# Patient Record
Sex: Female | Born: 1965 | Race: Asian | Hispanic: No | Marital: Single | Smoking: Never smoker
Health system: Southern US, Community
[De-identification: ages and names within clinical notes are randomized; demographics above are authoritative.]

## PROBLEM LIST (undated history)

## (undated) DIAGNOSIS — G5600 Carpal tunnel syndrome, unspecified upper limb: Secondary | ICD-10-CM

## (undated) DIAGNOSIS — K219 Gastro-esophageal reflux disease without esophagitis: Secondary | ICD-10-CM

---

## 2000-08-08 ENCOUNTER — Other Ambulatory Visit: Admission: RE | Admit: 2000-08-08 | Discharge: 2000-08-08 | Payer: Self-pay | Admitting: *Deleted

## 2001-08-20 ENCOUNTER — Other Ambulatory Visit: Admission: RE | Admit: 2001-08-20 | Discharge: 2001-08-20 | Payer: Self-pay | Admitting: *Deleted

## 2003-01-21 ENCOUNTER — Other Ambulatory Visit: Admission: RE | Admit: 2003-01-21 | Discharge: 2003-01-21 | Payer: Self-pay | Admitting: Obstetrics and Gynecology

## 2006-02-28 ENCOUNTER — Ambulatory Visit: Payer: Self-pay | Admitting: Nurse Practitioner

## 2006-03-20 ENCOUNTER — Ambulatory Visit: Payer: Self-pay | Admitting: *Deleted

## 2006-03-20 ENCOUNTER — Ambulatory Visit: Payer: Self-pay | Admitting: Nurse Practitioner

## 2006-03-20 ENCOUNTER — Encounter (INDEPENDENT_AMBULATORY_CARE_PROVIDER_SITE_OTHER): Payer: Self-pay | Admitting: *Deleted

## 2006-03-20 ENCOUNTER — Other Ambulatory Visit: Admission: RE | Admit: 2006-03-20 | Discharge: 2006-03-20 | Payer: Self-pay | Admitting: Nurse Practitioner

## 2006-11-20 ENCOUNTER — Ambulatory Visit: Payer: Self-pay | Admitting: Internal Medicine

## 2007-02-07 ENCOUNTER — Encounter (INDEPENDENT_AMBULATORY_CARE_PROVIDER_SITE_OTHER): Payer: Self-pay | Admitting: *Deleted

## 2007-07-19 ENCOUNTER — Ambulatory Visit (HOSPITAL_COMMUNITY): Admission: RE | Admit: 2007-07-19 | Discharge: 2007-07-19 | Payer: Self-pay | Admitting: Family Medicine

## 2008-03-26 ENCOUNTER — Encounter: Admission: RE | Admit: 2008-03-26 | Discharge: 2008-03-26 | Payer: Self-pay | Admitting: Family Medicine

## 2009-03-01 ENCOUNTER — Emergency Department (HOSPITAL_COMMUNITY): Admission: EM | Admit: 2009-03-01 | Discharge: 2009-03-01 | Payer: Self-pay | Admitting: Emergency Medicine

## 2009-05-18 ENCOUNTER — Encounter: Admission: RE | Admit: 2009-05-18 | Discharge: 2009-05-18 | Payer: Self-pay | Admitting: Family Medicine

## 2009-10-14 ENCOUNTER — Emergency Department (HOSPITAL_COMMUNITY): Admission: EM | Admit: 2009-10-14 | Discharge: 2009-10-15 | Payer: Self-pay | Admitting: Emergency Medicine

## 2010-08-09 LAB — URINE MICROSCOPIC-ADD ON

## 2010-08-09 LAB — DIFFERENTIAL
Basophils Absolute: 0 10*3/uL (ref 0.0–0.1)
Lymphocytes Relative: 12 % (ref 12–46)
Neutro Abs: 9 10*3/uL — ABNORMAL HIGH (ref 1.7–7.7)
Neutrophils Relative %: 81 % — ABNORMAL HIGH (ref 43–77)

## 2010-08-09 LAB — URINALYSIS, ROUTINE W REFLEX MICROSCOPIC
Ketones, ur: NEGATIVE mg/dL
Nitrite: NEGATIVE
Specific Gravity, Urine: 1.009 (ref 1.005–1.030)
pH: 7.5 (ref 5.0–8.0)

## 2010-08-09 LAB — CBC
Platelets: 166 10*3/uL (ref 150–400)
RDW: 12.3 % (ref 11.5–15.5)
WBC: 11.2 10*3/uL — ABNORMAL HIGH (ref 4.0–10.5)

## 2010-08-09 LAB — POCT I-STAT, CHEM 8
Calcium, Ion: 1.09 mmol/L — ABNORMAL LOW (ref 1.12–1.32)
Chloride: 104 mEq/L (ref 96–112)
Glucose, Bld: 120 mg/dL — ABNORMAL HIGH (ref 70–99)
HCT: 39 % (ref 36.0–46.0)

## 2010-08-26 LAB — POCT PREGNANCY, URINE: Preg Test, Ur: NEGATIVE

## 2010-08-26 LAB — POCT I-STAT, CHEM 8
BUN: 20 mg/dL (ref 6–23)
Calcium, Ion: 1.06 mmol/L — ABNORMAL LOW (ref 1.12–1.32)
Creatinine, Ser: 0.6 mg/dL (ref 0.4–1.2)
TCO2: 21 mmol/L (ref 0–100)

## 2010-08-26 LAB — POCT URINALYSIS DIP (DEVICE)
Protein, ur: NEGATIVE mg/dL
Specific Gravity, Urine: 1.02 (ref 1.005–1.030)
Urobilinogen, UA: 1 mg/dL (ref 0.0–1.0)

## 2010-08-26 LAB — POCT H PYLORI SCREEN: H. PYLORI SCREEN, POC: NEGATIVE

## 2010-10-06 ENCOUNTER — Other Ambulatory Visit: Payer: Self-pay | Admitting: Family Medicine

## 2010-10-06 ENCOUNTER — Ambulatory Visit
Admission: RE | Admit: 2010-10-06 | Discharge: 2010-10-06 | Disposition: A | Discharge status: Home / Self Care | Payer: Medicaid Other | Source: Ambulatory Visit | Attending: Family Medicine | Admitting: Family Medicine

## 2010-10-06 DIAGNOSIS — R1013 Epigastric pain: Secondary | ICD-10-CM

## 2010-10-06 DIAGNOSIS — Z1231 Encounter for screening mammogram for malignant neoplasm of breast: Secondary | ICD-10-CM

## 2010-10-07 ENCOUNTER — Other Ambulatory Visit: Payer: Self-pay | Admitting: Gastroenterology

## 2010-10-07 DIAGNOSIS — R1013 Epigastric pain: Secondary | ICD-10-CM

## 2010-10-11 ENCOUNTER — Other Ambulatory Visit: Payer: Medicaid Other

## 2010-10-12 ENCOUNTER — Ambulatory Visit
Admission: RE | Admit: 2010-10-12 | Discharge: 2010-10-12 | Disposition: A | Discharge status: Home / Self Care | Payer: Medicaid Other | Source: Ambulatory Visit | Attending: Family Medicine | Admitting: Family Medicine

## 2010-10-12 DIAGNOSIS — Z1231 Encounter for screening mammogram for malignant neoplasm of breast: Secondary | ICD-10-CM

## 2010-10-13 ENCOUNTER — Ambulatory Visit
Admission: RE | Admit: 2010-10-13 | Discharge: 2010-10-13 | Disposition: A | Discharge status: Home / Self Care | Payer: Medicaid Other | Source: Ambulatory Visit | Attending: Gastroenterology | Admitting: Gastroenterology

## 2010-10-13 ENCOUNTER — Other Ambulatory Visit: Payer: Medicaid Other

## 2010-10-13 DIAGNOSIS — R1013 Epigastric pain: Secondary | ICD-10-CM

## 2011-04-30 ENCOUNTER — Encounter (HOSPITAL_COMMUNITY): Payer: Self-pay | Admitting: *Deleted

## 2011-04-30 ENCOUNTER — Encounter: Payer: Self-pay | Admitting: *Deleted

## 2011-04-30 ENCOUNTER — Emergency Department (HOSPITAL_COMMUNITY): Payer: Medicaid Other

## 2011-04-30 ENCOUNTER — Emergency Department (HOSPITAL_COMMUNITY)
Admission: EM | Admit: 2011-04-30 | Discharge: 2011-05-01 | Disposition: A | Discharge status: Home / Self Care | Payer: Medicaid Other | Attending: Emergency Medicine | Admitting: Emergency Medicine

## 2011-04-30 ENCOUNTER — Emergency Department (INDEPENDENT_AMBULATORY_CARE_PROVIDER_SITE_OTHER)
Admission: EM | Admit: 2011-04-30 | Discharge: 2011-04-30 | Disposition: A | Discharge status: Nursing Home (Includes ICF) | Payer: Medicaid Other | Source: Home / Self Care | Attending: Emergency Medicine | Admitting: Emergency Medicine

## 2011-04-30 DIAGNOSIS — R1013 Epigastric pain: Secondary | ICD-10-CM

## 2011-04-30 DIAGNOSIS — R1011 Right upper quadrant pain: Secondary | ICD-10-CM

## 2011-04-30 DIAGNOSIS — R110 Nausea: Secondary | ICD-10-CM | POA: Insufficient documentation

## 2011-04-30 DIAGNOSIS — K219 Gastro-esophageal reflux disease without esophagitis: Secondary | ICD-10-CM | POA: Insufficient documentation

## 2011-04-30 DIAGNOSIS — R109 Unspecified abdominal pain: Secondary | ICD-10-CM | POA: Insufficient documentation

## 2011-04-30 LAB — COMPREHENSIVE METABOLIC PANEL
ALT: 9 U/L (ref 0–35)
Albumin: 3.7 g/dL (ref 3.5–5.2)
Alkaline Phosphatase: 55 U/L (ref 39–117)
BUN: 11 mg/dL (ref 6–23)
Chloride: 105 mEq/L (ref 96–112)
Potassium: 4.7 mEq/L (ref 3.5–5.1)
Sodium: 139 mEq/L (ref 135–145)
Total Bilirubin: 0.3 mg/dL (ref 0.3–1.2)
Total Protein: 7.9 g/dL (ref 6.0–8.3)

## 2011-04-30 LAB — CBC
HCT: 37.9 % (ref 36.0–46.0)
Hemoglobin: 12.8 g/dL (ref 12.0–15.0)
RDW: 12.7 % (ref 11.5–15.5)
WBC: 13.8 10*3/uL — ABNORMAL HIGH (ref 4.0–10.5)

## 2011-04-30 LAB — LIPASE, BLOOD: Lipase: 33 U/L (ref 11–59)

## 2011-04-30 NOTE — ED Notes (Signed)
Patient transported to Ultrasound 

## 2011-04-30 NOTE — ED Notes (Signed)
Pt returned from US

## 2011-04-30 NOTE — ED Provider Notes (Signed)
History     CSN: [id number] Arrival date & time: 04/30/2011  8:47 PM   First MD Initiated Contact with Patient 04/30/11 2101      Chief Complaint  Patient presents with  . Abdominal Pain    (Consider location/radiation/quality/duration/timing/severity/associated sxs/prior treatment) Patient is a 45 y.o. female presenting with abdominal pain. The history is provided by the patient and a relative. The history is limited by a language barrier.  Abdominal Pain The primary symptoms of the illness include abdominal pain and nausea. The primary symptoms of the illness do not include fever, shortness of breath, vomiting, diarrhea or dysuria. The current episode started more than 2 days ago. The problem has not changed (waxing and waning) since onset. The abdominal pain is located in the epigastric region and RUQ. The abdominal pain does not radiate. The severity of the abdominal pain is 6/10. The abdominal pain is relieved by nothing. Exacerbated by: nothing.  The patient states that she believes she is currently not pregnant. The patient has not had a change in bowel habit. Symptoms associated with the illness do not include chills or back pain. Significant associated medical issues include GERD. Significant associated medical issues do not include PUD.    Past Medical History  Diagnosis Date  . Acid reflux     History reviewed. No pertinent past surgical history.  History reviewed. No pertinent family history.  History  Substance Use Topics  . Smoking status: Never Smoker   . Smokeless tobacco: Not on file  . Alcohol Use: No    OB History    Grav Para Term Preterm Abortions TAB SAB Ect Mult Living                  Review of Systems  Constitutional: Negative for fever, chills and appetite change.  Respiratory: Negative for cough and shortness of breath.   Cardiovascular: Negative for chest pain and palpitations.  Gastrointestinal: Positive for nausea and abdominal pain.  Negative for vomiting, diarrhea and abdominal distention.  Genitourinary: Negative for dysuria.  Musculoskeletal: Negative for back pain.  Skin: Negative for color change and rash.  Neurological: Negative for light-headedness and headaches.  All other systems reviewed and are negative.    Allergies  Review of patient's allergies indicates no known allergies.  Home Medications   Current Outpatient Rx  Name Route Sig Dispense Refill  . OMEPRAZOLE 40 MG PO CPDR Oral Take 40 mg by mouth daily.        BP 146/93  Pulse 102  Temp(Src) 100.1 F (37.8 C) (Oral)  Resp 20  SpO2 100%  LMP 04/26/2011  Physical Exam  Nursing note and vitals reviewed. Constitutional: She is oriented to person, place, and time. She appears well-developed and well-nourished.  HENT:  Head: Normocephalic and atraumatic.  Eyes: Pupils are equal, round, and reactive to light.  Cardiovascular: Normal rate, regular rhythm, normal heart sounds and intact distal pulses.   Pulmonary/Chest: Effort normal and breath sounds normal. No respiratory distress.  Abdominal: Soft. She exhibits no distension. There is tenderness (mild, RUQ, epigastric). There is no rebound, no guarding and negative Murphy's sign.  Neurological: She is alert and oriented to person, place, and time.  Skin: Skin is warm and dry.  Psychiatric: She has a normal mood and affect.    ED Course  Procedures (including critical care time)  Labs Reviewed  CBC - Abnormal; Notable for the following:    WBC 13.8 (*)    All other components within normal  limits  COMPREHENSIVE METABOLIC PANEL - Abnormal; Notable for the following:    Glucose, Bld 101 (*)    Creatinine, Ser 0.48 (*)    All other components within normal limits  LIPASE, BLOOD   US Abdomen Complete  04/30/2011  *RADIOLOGY REPORT*  Clinical Data:  Mid abdominal pain.  COMPLETE ABDOMINAL ULTRASOUND 04/30/2011:  Comparison:  Abdominal ultrasound 07/19/2007 [facility]. CT  abdomen 03/26/2008 [facility].  Findings:  Gallbladder:  No shadowing gallstones or echogenic sludge.  No gallbladder wall thickening or pericholecystic fluid.  Negative sonographic Murphy's sign according to the ultrasound technologist.  Common bile duct:  Normal in caliber with maximum diameter approximating 3 mm.  Liver:  Normal size and echotexture without focal parenchymal abnormality.  Patent portal vein with hepatopetal flow.  IVC:  Patent.  Pancreas:  Although the pancreas is difficult to visualize in its entirety, no focal pancreatic abnormality is identified.  Spleen:  Normal size and echotexture without focal parenchymal abnormality.  Right Kidney:  No hydronephrosis.  Well-preserved cortex.  No shadowing calculi.  Normal size and parenchymal echotexture without focal abnormalities.  Approximately 11.0 cm in length.  Left Kidney:  No hydronephrosis.  Well-preserved cortex.  No shadowing calculi.  Normal size and parenchymal echotexture without focal abnormalities.  Approximately 11.2 cm in length.  Abdominal aorta:  Normal in caliber throughout its visualized course in the abdomen without significant atherosclerosis.  IMPRESSION: Normal examination.  Original Report Authenticated By: Arnell Sieving, M.D.     1. Abdominal pain, acute, epigastric   2. Reflux       MDM  This is a 45 year old female with a 3 days history of intermittent epigastric and right upper quadrant pain. She states these episodes happen spontaneously, without recognizable inciting factors, and last approximately an hour before resolving. Describes it as a sharp, well localized pain, without radiation. Has been having mild nausea without vomiting, as well as subjective fevers at home. Has a history of reflux, though states this is different from her normal pain. She denies any lower abdominal pain, dysuria, chest pain, shortness of breath, changes in bowel movements or any other complaints. The patient is  currently denying any pain at this point in time. Exam is remarkable for very mild epigastric and right upper quadrant tenderness, without rebound or guarding, without a Murphy sign. Her temperature is noted to be 100.1. Even though she is currently without complaints, the intermittent quality of her pain does raise suspicion for possible cholelithiasis. Will check lab work and ultrasound to evaluate further.  She has mild leukocytosis, labs otherwise unremarkable. Ultrasound is negative for acute findings. The patient's exam remained unchanged, without any abdominal tenderness, and she currently denies abdominal pain. Discussed with patient that her symptoms are likely due to reflux, and to continue taking the medication as prescribed by her doctor, with followup in about one to 2 weeks for reevaluation of her symptoms at that time. She can take over-the-counter medications for additional symptom relief in that period of time. Discussed indications for return. Patient and family expressed understanding.       Theotis Burrow, MD 04/30/11 2352

## 2011-04-30 NOTE — ED Notes (Signed)
Phlebotomist called for blood draw

## 2011-04-30 NOTE — ED Provider Notes (Signed)
History     CSN: [id number] Arrival date & time: 04/30/2011  6:28 PM   First MD Initiated Contact with Patient 04/30/11 1722      Chief Complaint  Patient presents with  . Abdominal Pain    (Consider location/radiation/quality/duration/timing/severity/associated sxs/prior treatment) HPI Comments: Been having pian since Tuesday getting worse (points to RUQ and epigastric area)  Patient is a 45 y.o. female presenting with abdominal pain. The history is provided by the patient and a relative. The history is limited by a language barrier. A language interpreter was used.  Abdominal Pain The primary symptoms of the illness include abdominal pain. The primary symptoms of the illness do not include fever, fatigue, shortness of breath, nausea, vomiting, dysuria or vaginal bleeding. Episode onset: x 5 days. The onset of the illness was sudden.  The patient states that she believes she is currently not pregnant. The patient has not had a change in bowel habit. Additional symptoms associated with the illness include chills and anorexia. Symptoms associated with the illness do not include constipation, urgency, frequency or back pain. Significant associated medical issues include GERD and substance abuse. Significant associated medical issues do not include PUD, diabetes or cardiac disease.    No past medical history on file.  No past surgical history on file.  No family history on file.  History  Substance Use Topics  . Smoking status: Not on file  . Smokeless tobacco: Not on file  . Alcohol Use: Not on file    OB History    No data available      Review of Systems  Constitutional: Positive for chills. Negative for fever and fatigue.  Respiratory: Negative for shortness of breath.   Gastrointestinal: Positive for abdominal pain and anorexia. Negative for nausea, vomiting and constipation.  Genitourinary: Negative for dysuria, urgency, frequency and vaginal bleeding.    Musculoskeletal: Negative for back pain.    Allergies  Review of patient's allergies indicates not on file.  Home Medications  No current outpatient prescriptions on file.  BP 141/82  Pulse 87  Temp(Src) 97.7 F (36.5 C) (Oral)  Resp 20  SpO2 100%  Physical Exam  ED Course  Procedures (including critical care time)  Labs Reviewed - No data to display No results found.   1. Right upper quadrant pain       MDM  RUQ pian for 5 days. Transferred to rule out complicated  Gallbladder disorder- (cholecystitis vs lithiasis). Stable.        Jimmie Molly, MD 04/30/11 [phone]

## 2011-04-30 NOTE — ED Notes (Signed)
Patient transferred from urgent care for further evaluation of his abdominal pain.  RUQ pain for five days.  Transferred to rule out gallbladder problems.

## 2011-04-30 NOTE — ED Notes (Signed)
Pt speadks vietnamese daughter and husband speak english

## 2011-04-30 NOTE — ED Notes (Signed)
Pt with right upper /epigastric abdominal pain onset Tuesday denies n/v -

## 2011-04-30 NOTE — ED Notes (Signed)
A&Ox3; no signs of distress; ambulated with a steady gait; respirations even and unlabored; no questions at this time.

## 2011-05-01 NOTE — ED Provider Notes (Signed)
I saw and evaluated the patient, reviewed the resident's note and I agree with the findings and plan. The patient with mild epigastric abdominal pain and right upper quadrant pain who was sent from urgent care for further evaluation. Patient was recently placed on PPIs but symptoms are not improving after taking them for 2 days. On my exam patient abdomen is soft and nontender lab work is unrevealing. Will have her follow up with PCP.   Gwyneth Sprout, MD 05/01/11 1524

## 2011-12-22 ENCOUNTER — Other Ambulatory Visit: Payer: Self-pay | Admitting: Family Medicine

## 2011-12-22 DIAGNOSIS — Z1231 Encounter for screening mammogram for malignant neoplasm of breast: Secondary | ICD-10-CM

## 2011-12-29 ENCOUNTER — Ambulatory Visit
Admission: RE | Admit: 2011-12-29 | Discharge: 2011-12-29 | Disposition: A | Discharge status: Home / Self Care | Payer: Medicaid Other | Source: Ambulatory Visit | Attending: Family Medicine | Admitting: Family Medicine

## 2011-12-29 DIAGNOSIS — Z1231 Encounter for screening mammogram for malignant neoplasm of breast: Secondary | ICD-10-CM

## 2012-05-24 ENCOUNTER — Encounter (HOSPITAL_COMMUNITY): Payer: Self-pay | Admitting: *Deleted

## 2012-05-24 ENCOUNTER — Emergency Department (HOSPITAL_COMMUNITY)
Admission: EM | Admit: 2012-05-24 | Discharge: 2012-05-25 | Disposition: A | Discharge status: Home / Self Care | Payer: Medicaid Other | Attending: Emergency Medicine | Admitting: Emergency Medicine

## 2012-05-24 ENCOUNTER — Emergency Department (HOSPITAL_COMMUNITY): Payer: Medicaid Other

## 2012-05-24 DIAGNOSIS — R1084 Generalized abdominal pain: Secondary | ICD-10-CM | POA: Insufficient documentation

## 2012-05-24 DIAGNOSIS — R195 Other fecal abnormalities: Secondary | ICD-10-CM | POA: Insufficient documentation

## 2012-05-24 DIAGNOSIS — K219 Gastro-esophageal reflux disease without esophagitis: Secondary | ICD-10-CM | POA: Insufficient documentation

## 2012-05-24 DIAGNOSIS — R109 Unspecified abdominal pain: Secondary | ICD-10-CM

## 2012-05-24 LAB — COMPREHENSIVE METABOLIC PANEL
ALT: 11 U/L (ref 0–35)
AST: 25 U/L (ref 0–37)
Alkaline Phosphatase: 45 U/L (ref 39–117)
CO2: 23 mEq/L (ref 19–32)
Calcium: 9.1 mg/dL (ref 8.4–10.5)
GFR calc non Af Amer: >90 mL/min (ref >90)
Glucose, Bld: 96 mg/dL (ref 70–99)
Potassium: 4.8 mEq/L (ref 3.5–5.1)
Sodium: 135 mEq/L (ref 135–145)
Total Protein: 7.8 g/dL (ref 6.0–8.3)

## 2012-05-24 LAB — URINALYSIS, ROUTINE W REFLEX MICROSCOPIC
Bilirubin Urine: NEGATIVE
Glucose, UA: NEGATIVE mg/dL
Hgb urine dipstick: NEGATIVE
Ketones, ur: NEGATIVE mg/dL
Nitrite: NEGATIVE
Specific Gravity, Urine: 1.012 (ref 1.005–1.030)
pH: 7 (ref 5.0–8.0)

## 2012-05-24 LAB — URINE MICROSCOPIC-ADD ON

## 2012-05-24 LAB — CBC WITH DIFFERENTIAL/PLATELET
Basophils Absolute: 0 10*3/uL (ref 0.0–0.1)
Eosinophils Relative: 0 % (ref 0–5)
Lymphocytes Relative: 20 % (ref 12–46)
Lymphs Abs: 2.3 10*3/uL (ref 0.7–4.0)
MCV: 87.8 fL (ref 78.0–100.0)
Neutrophils Relative %: 75 % (ref 43–77)
Platelets: 225 10*3/uL (ref 150–400)
RBC: 4.68 MIL/uL (ref 3.87–5.11)
RDW: 12.7 % (ref 11.5–15.5)
WBC: 11.3 10*3/uL — ABNORMAL HIGH (ref 4.0–10.5)

## 2012-05-24 MED ORDER — SODIUM CHLORIDE 0.9 % IV BOLUS (SEPSIS)
1000.0000 mL | Freq: Once | INTRAVENOUS | Status: AC
  Administered 2012-05-24: 1000 mL via INTRAVENOUS

## 2012-05-24 MED ORDER — IOHEXOL 300 MG/ML  SOLN
100.0000 mL | Freq: Once | INTRAMUSCULAR | Status: AC | PRN
  Administered 2012-05-24: 100 mL via INTRAVENOUS

## 2012-05-24 NOTE — ED Notes (Signed)
Pt in with daughter who is helping to translate. Pt reports generalized abd pain x2 days. Denies n/v. "small amount" of diarrhea.

## 2012-05-24 NOTE — ED Provider Notes (Signed)
History     CSN: [id number]  Arrival date & time 05/24/12  1757   First MD Initiated Contact with Patient 05/24/12 2201      Chief Complaint  Patient presents with  . Abdominal Pain    (Consider location/radiation/quality/duration/timing/severity/associated sxs/prior treatment) HPI Comments: Patient presents with a two-day history of worsening pain to her abdomen. She describes it is across the middle and left side of her abdomen. She's had small amount of loose stools but no ongoing diarrhea. No constipation. No nausea or vomiting. No fevers or chills. She denies any urinary symptoms. She denies a history of bowel problems in the past. She's had C-section but no other abdominal surgeries.  Patient is a 47 y.o. female presenting with abdominal pain.  Abdominal Pain The primary symptoms of the illness include abdominal pain. The primary symptoms of the illness do not include fever, fatigue, shortness of breath, nausea, vomiting or diarrhea.  Symptoms associated with the illness do not include chills, diaphoresis, hematuria, frequency or back pain.    Past Medical History  Diagnosis Date  . Acid reflux     Past Surgical History  Procedure Date  . Cesarean section     No family history on file.  History  Substance Use Topics  . Smoking status: Never Smoker   . Smokeless tobacco: Not on file  . Alcohol Use: No    OB History    Grav Para Term Preterm Abortions TAB SAB Ect Mult Living                  Review of Systems  Constitutional: Negative for fever, chills, diaphoresis and fatigue.  HENT: Negative for congestion, rhinorrhea and sneezing.   Eyes: Negative.   Respiratory: Negative for cough, chest tightness and shortness of breath.   Cardiovascular: Negative for chest pain and leg swelling.  Gastrointestinal: Positive for abdominal pain. Negative for nausea, vomiting, diarrhea and blood in stool.  Genitourinary: Negative for frequency, hematuria, flank pain and  difficulty urinating.  Musculoskeletal: Negative for back pain and arthralgias.  Skin: Negative for rash.  Neurological: Negative for dizziness, speech difficulty, weakness, numbness and headaches.    Allergies  Review of patient's allergies indicates no known allergies.  Home Medications   Current Outpatient Rx  Name  Route  Sig  Dispense  Refill  . ACETAMINOPHEN 500 MG PO TABS   Oral   Take 500 mg by mouth every 6 (six) hours as needed.         Marland Kitchen DICYCLOMINE HCL 20 MG PO TABS   Oral   Take 1 tablet (20 mg total) by mouth 2 (two) times daily.   20 tablet   0     BP 143/86  Pulse 86  Temp 97.7 F (36.5 C) (Oral)  Resp 16  SpO2 100%  LMP 05/23/2012  Physical Exam  Constitutional: She is oriented to person, place, and time. She appears well-developed and well-nourished.  HENT:  Head: Normocephalic and atraumatic.  Eyes: Pupils are equal, round, and reactive to light.  Neck: Normal range of motion. Neck supple.  Cardiovascular: Normal rate, regular rhythm and normal heart sounds.   Pulmonary/Chest: Effort normal and breath sounds normal. No respiratory distress. She has no wheezes. She has no rales. She exhibits no tenderness.  Abdominal: Soft. Bowel sounds are normal. There is tenderness (mild tenderness in mid and left abdomen. Also some mild tenderness in lower abdomen but no specific pain in the right lower quadrant. No pain over the  gallbladder.). There is no rebound and no guarding.  Musculoskeletal: Normal range of motion. She exhibits no edema.  Lymphadenopathy:    She has no cervical adenopathy.  Neurological: She is alert and oriented to person, place, and time.  Skin: Skin is warm and dry. No rash noted.  Psychiatric: She has a normal mood and affect.    ED Course  Procedures (including critical care time)  Results for orders placed during the hospital encounter of 05/24/12  URINALYSIS, ROUTINE W REFLEX MICROSCOPIC      Component Value Range   Color,  Urine YELLOW  YELLOW   APPearance CLEAR  CLEAR   Specific Gravity, Urine 1.012  1.005 - 1.030   pH 7.0  5.0 - 8.0   Glucose, UA NEGATIVE  NEGATIVE mg/dL   Hgb urine dipstick NEGATIVE  NEGATIVE   Bilirubin Urine NEGATIVE  NEGATIVE   Ketones, ur NEGATIVE  NEGATIVE mg/dL   Protein, ur NEGATIVE  NEGATIVE mg/dL   Urobilinogen, UA 0.2  0.0 - 1.0 mg/dL   Nitrite NEGATIVE  NEGATIVE   Leukocytes, UA TRACE (*) NEGATIVE  PREGNANCY, URINE      Component Value Range   Preg Test, Ur NEGATIVE  NEGATIVE  URINE MICROSCOPIC-ADD ON      Component Value Range   Squamous Epithelial / LPF FEW (*) RARE   WBC, UA 0-2  <3 WBC/hpf   Bacteria, UA FEW (*) RARE   Urine-Other MUCOUS PRESENT    CBC WITH DIFFERENTIAL      Component Value Range   WBC 11.3 (*) 4.0 - 10.5 K/uL   RBC 4.68  3.87 - 5.11 MIL/uL   Hemoglobin 14.1  12.0 - 15.0 g/dL   HCT 16.1  09.6 - 04.5 %   MCV 87.8  78.0 - 100.0 fL   MCH 30.1  26.0 - 34.0 pg   MCHC 34.3  30.0 - 36.0 g/dL   RDW 40.9  81.1 - 91.4 %   Platelets 225  150 - 400 K/uL   Neutrophils Relative 75  43 - 77 %   Neutro Abs 8.4 (*) 1.7 - 7.7 K/uL   Lymphocytes Relative 20  12 - 46 %   Lymphs Abs 2.3  0.7 - 4.0 K/uL   Monocytes Relative 4  3 - 12 %   Monocytes Absolute 0.5  0.1 - 1.0 K/uL   Eosinophils Relative 0  0 - 5 %   Eosinophils Absolute 0.1  0.0 - 0.7 K/uL   Basophils Relative 0  0 - 1 %   Basophils Absolute 0.0  0.0 - 0.1 K/uL  COMPREHENSIVE METABOLIC PANEL      Component Value Range   Sodium 135  135 - 145 mEq/L   Potassium 4.8  3.5 - 5.1 mEq/L   Chloride 101  96 - 112 mEq/L   CO2 23  19 - 32 mEq/L   Glucose, Bld 96  70 - 99 mg/dL   BUN 13  6 - 23 mg/dL   Creatinine, Ser 7.82 (*) 0.50 - 1.10 mg/dL   Calcium 9.1  8.4 - 95.6 mg/dL   Total Protein 7.8  6.0 - 8.3 g/dL   Albumin 3.8  3.5 - 5.2 g/dL   AST 25  0 - 37 U/L   ALT 11  0 - 35 U/L   Alkaline Phosphatase 45  39 - 117 U/L   Total Bilirubin 0.3  0.3 - 1.2 mg/dL   GFR calc non Af Amer >90  >90 mL/min  GFR calc Af Amer >90  >90 mL/min  LIPASE, BLOOD      Component Value Range   Lipase 34  11 - 59 U/L   Ct Abdomen Pelvis W Contrast  05/24/2012  *RADIOLOGY REPORT*  Clinical Data: Left abdominal pain with diarrhea for 2 days.  Mild leukocytosis.  CT ABDOMEN AND PELVIS WITH CONTRAST  Technique:  Multidetector CT imaging of the abdomen and pelvis was performed following the standard protocol during bolus administration of intravenous contrast.  Contrast: OMNIPAQUE IOHEXOL 300 MG/ML  SOLN  Comparison: Acute abdominal series same date.  Abdominal CT 03/26/2008.  Findings: The lung bases are clear.  There is no pleural effusion. The liver, gallbladder, biliary system and pancreas appear normal. The spleen, adrenal glands and kidneys appear normal.  The stomach and small bowel appear normal.  A retrocecal appendix is slightly more prominent than on the prior examination with a maximal transverse diameter of 7 mm.  No surrounding inflammatory change is seen. At the base of the appendix, there is apparent wall thickening involving the cecum, best seen on the coronal images 34 and 35.  There is no surrounding inflammatory change.  The colon otherwise appears normal.  The uterus, ovaries and bladder appears stable.  There is an intrauterine device.  There are no enlarged abdominal pelvic lymph nodes.  Mild aorto iliac atherosclerosis appears unchanged.  IMPRESSION:  1.  No explanation for left lower quadrant abdominal pain demonstrated. 2.  There is atypical wall thickening involving the cecum at the base of the appendix.  The appendix is only slightly more prominent than on the prior CT and demonstrates no surrounding inflammatory change. This could reflect an atypical inflammatory process, but does not have an appearance suggestive of acute appendicitis. Short-term follow-up may be helpful if the patient remains symptomatic. 3.  Unremarkable solid parenchymal organs.   Original Report Authenticated By: Carey Bullocks, M.D.    Dg Abd Acute W/chest  05/24/2012  *RADIOLOGY REPORT*  Clinical Data: Abdominal pain.  ACUTE ABDOMEN SERIES (ABDOMEN 2 VIEW & CHEST 1 VIEW)  Comparison: Upper GI series 10/13/2010.  Chest radiographs 10/06/2010.  Findings: The heart size and mediastinal contours are stable.  The lungs are clear.  There is no pleural effusion or pneumothorax.  The bowel gas pattern is normal.  There is no free intraperitoneal air or suspicious calcification.  An intrauterine device is noted. The osseous structures are unchanged.  IMPRESSION: No active cardiopulmonary or abdominal process identified.   Original Report Authenticated By: Carey Bullocks, M.D.        1. Abdominal pain       MDM  Patient with minimal left-sided abdominal pain. Her abdominal exam is unremarkable. Her CT scan did not show any evidence of diverticulitis or other etiology for the pain. There is a question of some inflammation around the appendix so well by the patient to return in 24 hours for recheck or sooner if her symptoms worsen. If she can follow up with her primary care physician for a recheck she could do that as well.        Rolan Bucco, MD 05/25/12 [phone]

## 2012-05-25 MED ORDER — DICYCLOMINE HCL 20 MG PO TABS: 20.0000 mg | ORAL_TABLET | Freq: Two times a day (BID) | ORAL | Status: DC

## 2013-04-04 ENCOUNTER — Other Ambulatory Visit: Payer: Self-pay | Admitting: Family Medicine

## 2013-04-04 DIAGNOSIS — Z1231 Encounter for screening mammogram for malignant neoplasm of breast: Secondary | ICD-10-CM

## 2013-04-09 ENCOUNTER — Encounter: Payer: Self-pay | Admitting: Gastroenterology

## 2013-05-02 ENCOUNTER — Ambulatory Visit: Payer: Medicaid Other

## 2013-05-14 ENCOUNTER — Ambulatory Visit: Payer: Medicaid Other

## 2013-05-21 ENCOUNTER — Ambulatory Visit
Admission: RE | Admit: 2013-05-21 | Discharge: 2013-05-21 | Disposition: A | Discharge status: Home / Self Care | Payer: Medicaid Other | Source: Ambulatory Visit | Attending: Family Medicine | Admitting: Family Medicine

## 2013-05-21 DIAGNOSIS — Z1231 Encounter for screening mammogram for malignant neoplasm of breast: Secondary | ICD-10-CM

## 2013-05-27 ENCOUNTER — Ambulatory Visit: Payer: Medicaid Other | Admitting: Gastroenterology

## 2013-06-17 ENCOUNTER — Ambulatory Visit: Payer: Medicaid Other | Admitting: Gastroenterology

## 2013-06-19 ENCOUNTER — Ambulatory Visit: Payer: Medicaid Other | Admitting: Gastroenterology

## 2013-06-21 ENCOUNTER — Encounter: Payer: Self-pay | Admitting: Gastroenterology

## 2013-07-23 ENCOUNTER — Encounter: Payer: Self-pay | Admitting: Gastroenterology

## 2013-07-23 ENCOUNTER — Other Ambulatory Visit (INDEPENDENT_AMBULATORY_CARE_PROVIDER_SITE_OTHER): Payer: Medicaid Other

## 2013-07-23 ENCOUNTER — Ambulatory Visit (INDEPENDENT_AMBULATORY_CARE_PROVIDER_SITE_OTHER): Payer: Medicaid Other | Admitting: Gastroenterology

## 2013-07-23 VITALS — BP 114/80 | HR 80 | Ht 61.0 in | Wt 141.4 lb

## 2013-07-23 DIAGNOSIS — R1013 Epigastric pain: Secondary | ICD-10-CM | POA: Insufficient documentation

## 2013-07-23 DIAGNOSIS — K219 Gastro-esophageal reflux disease without esophagitis: Secondary | ICD-10-CM

## 2013-07-23 LAB — HEPATIC FUNCTION PANEL
ALK PHOS: 46 U/L (ref 39–117)
ALT: 13 U/L (ref 0–35)
AST: 18 U/L (ref 0–37)
Albumin: 4.1 g/dL (ref 3.5–5.2)
BILIRUBIN DIRECT: 0 mg/dL (ref 0.0–0.3)
BILIRUBIN TOTAL: 0.7 mg/dL (ref 0.3–1.2)
Total Protein: 8 g/dL (ref 6.0–8.3)

## 2013-07-23 LAB — CBC WITH DIFFERENTIAL/PLATELET
Basophils Absolute: 0 10*3/uL (ref 0.0–0.1)
Basophils Relative: 0.4 % (ref 0.0–3.0)
EOS PCT: 1.3 % (ref 0.0–5.0)
Eosinophils Absolute: 0.1 10*3/uL (ref 0.0–0.7)
HEMATOCRIT: 44 % (ref 36.0–46.0)
Hemoglobin: 14.5 g/dL (ref 12.0–15.0)
LYMPHS ABS: 2.3 10*3/uL (ref 0.7–4.0)
LYMPHS PCT: 26.4 % (ref 12.0–46.0)
MCHC: 33 g/dL (ref 30.0–36.0)
MCV: 90.1 fl (ref 78.0–100.0)
MONOS PCT: 7.4 % (ref 3.0–12.0)
Monocytes Absolute: 0.6 10*3/uL (ref 0.1–1.0)
NEUTROS PCT: 64.5 % (ref 43.0–77.0)
Neutro Abs: 5.6 10*3/uL (ref 1.4–7.7)
PLATELETS: 201 10*3/uL (ref 150.0–400.0)
RBC: 4.88 Mil/uL (ref 3.87–5.11)
RDW: 13 % (ref 11.5–14.6)
WBC: 8.6 10*3/uL (ref 4.5–10.5)

## 2013-07-23 NOTE — Assessment & Plan Note (Signed)
One month history of burning epigastric pain and, what sounds like pyrosis.  This could be do to ulcer or nonulcer dyspepsia.  H. pylori infection should be considered as well.  Recommendations #1 trial of Nexium 40 mg daily #2 antireflux measures #3 check H. pylori antibody  Patient was instructed to call back in a week to 10 days if she's not improved at which point I would consider upper endoscopy

## 2013-07-23 NOTE — Patient Instructions (Signed)
Go to the basement for labs  Gastroesophageal Reflux Disease, Adult Gastroesophageal reflux disease (GERD) happens when acid from your stomach flows up into the esophagus. When acid comes in contact with the esophagus, the acid causes soreness (inflammation) in the esophagus. Over time, GERD may create small holes (ulcers) in the lining of the esophagus. CAUSES   Increased body weight. This puts pressure on the stomach, making acid rise from the stomach into the esophagus.  Smoking. This increases acid production in the stomach.  Drinking alcohol. This causes decreased pressure in the lower esophageal sphincter (valve or ring of muscle between the esophagus and stomach), allowing acid from the stomach into the esophagus.  Late evening meals and a full stomach. This increases pressure and acid production in the stomach.  A malformed lower esophageal sphincter. Sometimes, no cause is found. SYMPTOMS   Burning pain in the lower part of the mid-chest behind the breastbone and in the mid-stomach area. This may occur twice a week or more often.  Trouble swallowing.  Sore throat.  Dry cough.  Asthma-like symptoms including chest tightness, shortness of breath, or wheezing. DIAGNOSIS  Your caregiver may be able to diagnose GERD based on your symptoms. In some cases, X-rays and other tests may be done to check for complications or to check the condition of your stomach and esophagus. TREATMENT  Your caregiver may recommend over-the-counter or prescription medicines to help decrease acid production. Ask your caregiver before starting or adding any new medicines.  HOME CARE INSTRUCTIONS   Change the factors that you can control. Ask your caregiver for guidance concerning weight loss, quitting smoking, and alcohol consumption.  Avoid foods and drinks that make your symptoms worse, such as:  Caffeine or alcoholic drinks.  Chocolate.  Peppermint or mint flavorings.  Garlic and  onions.  Spicy foods.  Citrus fruits, such as oranges, lemons, or limes.  Tomato-based foods such as sauce, chili, salsa, and pizza.  Fried and fatty foods.  Avoid lying down for the 3 hours prior to your bedtime or prior to taking a nap.  Eat small, frequent meals instead of large meals.  Wear loose-fitting clothing. Do not wear anything tight around your waist that causes pressure on your stomach.  Raise the head of your bed 6 to 8 inches with wood blocks to help you sleep. Extra pillows will not help.  Only take over-the-counter or prescription medicines for pain, discomfort, or fever as directed by your caregiver.  Do not take aspirin, ibuprofen, or other nonsteroidal anti-inflammatory drugs (NSAIDs). SEEK IMMEDIATE MEDICAL CARE IF:   You have pain in your arms, neck, jaw, teeth, or back.  Your pain increases or changes in intensity or duration.  You develop nausea, vomiting, or sweating (diaphoresis).  You develop shortness of breath, or you faint.  Your vomit is green, yellow, black, or looks like coffee grounds or blood.  Your stool is red, bloody, or black. These symptoms could be signs of other problems, such as heart disease, gastric bleeding, or esophageal bleeding. MAKE SURE YOU:   Understand these instructions.  Will watch your condition.  Will get help right away if you are not doing well or get worse. Document Released: 02/16/2005 Document Revised: 08/01/2011 Document Reviewed: 11/26/2010 [facility] Patient Information 2014 [place], [place].  Use Nexium samples Call back in one week to let us know how you are doing

## 2013-07-23 NOTE — Progress Notes (Signed)
    _                                                                                                                History of Present Illness:  48 year old [place] female referred for evaluation of abdominal pain.  History was provided by a Nurse, learning disabilitytranslator.  Over the past month she has been complaining of burning epigastric pain that radiates into the chest.  It may occur at anytime during the day.  It is not affected by eating.  There's been no change in bowel habits or stool color.  She takes ibuprofen very intermittently.  She's been taking Pepto-Bismol without much improvement.  She has similar episode approximately a year ago that was treated with a PPI, with resolution.    History reviewed. No pertinent past medical history. Past Surgical History  Procedure Laterality Date  . Cesarean section      x2   family history includes Heart disease in her father and paternal grandfather. Current Outpatient Prescriptions  Medication Sig Dispense Refill  . bismuth subsalicylate (PEPTO BISMOL) 262 MG/15ML suspension Take 30 mLs by mouth every 6 (six) hours as needed.      Marland Kitchen. acetaminophen (TYLENOL) 500 MG tablet Take 500 mg by mouth every 6 (six) hours as needed.       No current facility-administered medications for this visit.   Allergies as of 07/23/2013  . (No Known Allergies)    reports that she has never smoked. She has never used smokeless tobacco. She reports that she does not drink alcohol or use illicit drugs.     Review of Systems: Pertinent positive and negative review of systems were noted in the above HPI section. All other review of systems were otherwise negative.  Vital signs were reviewed in today's medical record Physical Exam: General: Well developed , well nourished, no acute distress Skin: anicteric Head: Normocephalic and atraumatic Eyes:  sclerae anicteric, EOMI Ears: Normal auditory acuity Mouth: No deformity or lesions Neck: Supple, no masses  or thyromegaly Lungs: Clear throughout to auscultation Heart: Regular rate and rhythm; no murmurs, rubs or bruits Abdomen: Soft, non tender and non distended. No masses, hepatosplenomegaly or hernias noted. Normal Bowel sounds Rectal:deferred Musculoskeletal: Symmetrical with no gross deformities  Skin: No lesions on visible extremities Pulses:  Normal pulses noted Extremities: No clubbing, cyanosis, edema or deformities noted Neurological: Alert oriented x 4, grossly nonfocal Cervical Nodes:  No significant cervical adenopathy Inguinal Nodes: No significant inguinal adenopathy Psychological:  Alert and cooperative. Normal mood and affect  See Assessment and Plan under Problem List

## 2013-07-24 ENCOUNTER — Other Ambulatory Visit: Payer: Self-pay | Admitting: Gastroenterology

## 2013-07-24 ENCOUNTER — Other Ambulatory Visit: Payer: Medicaid Other

## 2013-07-26 LAB — HELICOBACTER PYLORI  SPECIAL ANTIGEN: H. PYLORI Antigen: NEGATIVE

## 2014-06-17 ENCOUNTER — Other Ambulatory Visit: Payer: Self-pay

## 2014-06-17 DIAGNOSIS — Z1231 Encounter for screening mammogram for malignant neoplasm of breast: Secondary | ICD-10-CM

## 2014-06-18 ENCOUNTER — Ambulatory Visit
Admission: RE | Admit: 2014-06-18 | Discharge: 2014-06-18 | Disposition: A | Discharge status: Home / Self Care | Payer: Medicaid Other | Source: Ambulatory Visit

## 2014-06-18 ENCOUNTER — Ambulatory Visit: Payer: Medicaid Other

## 2014-06-18 DIAGNOSIS — Z1231 Encounter for screening mammogram for malignant neoplasm of breast: Secondary | ICD-10-CM

## 2014-06-24 ENCOUNTER — Ambulatory Visit: Payer: Medicaid Other

## 2014-08-08 ENCOUNTER — Other Ambulatory Visit: Payer: Self-pay | Admitting: Orthopedic Surgery

## 2014-08-08 ENCOUNTER — Encounter (HOSPITAL_BASED_OUTPATIENT_CLINIC_OR_DEPARTMENT_OTHER): Payer: Self-pay | Admitting: *Deleted

## 2014-08-08 NOTE — Progress Notes (Signed)
Pt with daughter that speaks english-will get vietnamese interpreter dos-no labs needed
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
## 2014-08-11 ENCOUNTER — Ambulatory Visit (HOSPITAL_BASED_OUTPATIENT_CLINIC_OR_DEPARTMENT_OTHER)
Admission: RE | Admit: 2014-08-11 | Discharge: 2014-08-11 | Disposition: A | Discharge status: Home / Self Care | Payer: Medicaid Other | Source: Ambulatory Visit | Attending: Orthopedic Surgery | Admitting: Orthopedic Surgery

## 2014-08-11 ENCOUNTER — Encounter (HOSPITAL_BASED_OUTPATIENT_CLINIC_OR_DEPARTMENT_OTHER)
Admission: RE | Disposition: A | Discharge status: Home / Self Care | Payer: Self-pay | Source: Ambulatory Visit | Attending: Orthopedic Surgery

## 2014-08-11 ENCOUNTER — Ambulatory Visit (HOSPITAL_BASED_OUTPATIENT_CLINIC_OR_DEPARTMENT_OTHER): Payer: Medicaid Other | Admitting: Anesthesiology

## 2014-08-11 ENCOUNTER — Encounter (HOSPITAL_BASED_OUTPATIENT_CLINIC_OR_DEPARTMENT_OTHER): Payer: Self-pay

## 2014-08-11 DIAGNOSIS — K219 Gastro-esophageal reflux disease without esophagitis: Secondary | ICD-10-CM | POA: Diagnosis not present

## 2014-08-11 DIAGNOSIS — G5601 Carpal tunnel syndrome, right upper limb: Secondary | ICD-10-CM | POA: Diagnosis not present

## 2014-08-11 HISTORY — PX: CARPAL TUNNEL RELEASE: SHX101

## 2014-08-11 HISTORY — DX: Gastro-esophageal reflux disease without esophagitis: K21.9

## 2014-08-11 HISTORY — DX: Carpal tunnel syndrome, unspecified upper limb: G56.00

## 2014-08-11 LAB — POCT HEMOGLOBIN-HEMACUE: HEMOGLOBIN: 15.4 g/dL — AB (ref 12.0–15.0)

## 2014-08-11 SURGERY — RELEASE, CARPAL TUNNEL, ENDOSCOPIC
Anesthesia: Monitor Anesthesia Care | Site: Wrist | Laterality: Right

## 2014-08-11 MED ORDER — MIDAZOLAM HCL 5 MG/5ML IJ SOLN
INTRAMUSCULAR | Status: DC | PRN
  Administered 2014-08-11: 2 mg via INTRAVENOUS

## 2014-08-11 MED ORDER — BUPIVACAINE-EPINEPHRINE (PF) 0.5% -1:200000 IJ SOLN
INTRAMUSCULAR | Status: AC
  Filled 2014-08-11: qty 30

## 2014-08-11 MED ORDER — HYDROMORPHONE HCL 1 MG/ML IJ SOLN: 0.2500 mg | INTRAMUSCULAR | Status: DC | PRN

## 2014-08-11 MED ORDER — LIDOCAINE HCL 2 % IJ SOLN
INTRAMUSCULAR | Status: DC | PRN
  Administered 2014-08-11: 5 mL

## 2014-08-11 MED ORDER — FENTANYL CITRATE 0.05 MG/ML IJ SOLN
INTRAMUSCULAR | Status: DC | PRN
  Administered 2014-08-11: 100 ug via INTRAVENOUS

## 2014-08-11 MED ORDER — MIDAZOLAM HCL 2 MG/2ML IJ SOLN
INTRAMUSCULAR | Status: AC
  Filled 2014-08-11: qty 2

## 2014-08-11 MED ORDER — CEFAZOLIN SODIUM-DEXTROSE 2-3 GM-% IV SOLR
2.0000 g | INTRAVENOUS | Status: AC
  Administered 2014-08-11: 2 g via INTRAVENOUS

## 2014-08-11 MED ORDER — LACTATED RINGERS IV SOLN: INTRAVENOUS | Status: DC

## 2014-08-11 MED ORDER — FENTANYL CITRATE 0.05 MG/ML IJ SOLN
INTRAMUSCULAR | Status: AC
  Filled 2014-08-11: qty 4

## 2014-08-11 MED ORDER — PROPOFOL 10 MG/ML IV BOLUS
INTRAVENOUS | Status: DC | PRN
  Administered 2014-08-11 (×3): 20 mg via INTRAVENOUS

## 2014-08-11 MED ORDER — LIDOCAINE HCL 2 % IJ SOLN
INTRAMUSCULAR | Status: AC
  Filled 2014-08-11: qty 20

## 2014-08-11 MED ORDER — MIDAZOLAM HCL 2 MG/2ML IJ SOLN: 1.0000 mg | INTRAMUSCULAR | Status: DC | PRN

## 2014-08-11 MED ORDER — ONDANSETRON HCL 4 MG/2ML IJ SOLN
INTRAMUSCULAR | Status: DC | PRN
  Administered 2014-08-11: 4 mg via INTRAVENOUS

## 2014-08-11 MED ORDER — BUPIVACAINE-EPINEPHRINE (PF) 0.5% -1:200000 IJ SOLN
INTRAMUSCULAR | Status: DC | PRN
  Administered 2014-08-11: 5 mL

## 2014-08-11 MED ORDER — OXYCODONE HCL 5 MG/5ML PO SOLN: 5.0000 mg | Freq: Once | ORAL | Status: DC | PRN

## 2014-08-11 MED ORDER — OXYCODONE HCL 5 MG PO TABS: 5.0000 mg | ORAL_TABLET | Freq: Once | ORAL | Status: DC | PRN

## 2014-08-11 MED ORDER — FENTANYL CITRATE 0.05 MG/ML IJ SOLN: 50.0000 ug | INTRAMUSCULAR | Status: DC | PRN

## 2014-08-11 MED ORDER — LACTATED RINGERS IV SOLN
INTRAVENOUS | Status: DC
  Administered 2014-08-11: 10:00:00 via INTRAVENOUS

## 2014-08-11 MED ORDER — ONDANSETRON HCL 4 MG/2ML IJ SOLN: 4.0000 mg | Freq: Once | INTRAMUSCULAR | Status: DC | PRN

## 2014-08-11 MED ORDER — CEFAZOLIN SODIUM-DEXTROSE 2-3 GM-% IV SOLR
INTRAVENOUS | Status: AC
  Filled 2014-08-11: qty 50

## 2014-08-11 MED ORDER — HYDROCODONE-ACETAMINOPHEN 5-325 MG PO TABS: 1.0000 | ORAL_TABLET | Freq: Four times a day (QID) | ORAL | Status: DC | PRN

## 2014-08-11 SURGICAL SUPPLY — 43 items
APPLICATOR COTTON TIP 6IN STRL (MISCELLANEOUS) ×3 IMPLANT
BLADE HOOK ENDO STRL (BLADE) ×3 IMPLANT
BLADE SURG 15 STRL LF DISP TIS (BLADE) ×1 IMPLANT
BLADE SURG 15 STRL SS (BLADE) ×2
BLADE TRIANGLE EPF/EGR ENDO (BLADE) ×3 IMPLANT
BNDG COHESIVE 4X5 TAN STRL (GAUZE/BANDAGES/DRESSINGS) ×3 IMPLANT
BNDG ESMARK 4X9 LF (GAUZE/BANDAGES/DRESSINGS) ×3 IMPLANT
BNDG GAUZE ELAST 4 BULKY (GAUZE/BANDAGES/DRESSINGS) ×3 IMPLANT
CHLORAPREP W/TINT 26ML (MISCELLANEOUS) ×3 IMPLANT
CORDS BIPOLAR (ELECTRODE) IMPLANT
COVER BACK TABLE 60X90IN (DRAPES) ×3 IMPLANT
COVER MAYO STAND STRL (DRAPES) ×3 IMPLANT
CUFF TOURNIQUET SINGLE 18IN (TOURNIQUET CUFF) IMPLANT
DRAIN TLS ROUND 10FR (DRAIN) ×3 IMPLANT
DRAPE EXTREMITY T 121X128X90 (DRAPE) ×3 IMPLANT
DRAPE SURG 17X23 STRL (DRAPES) ×3 IMPLANT
DRSG EMULSION OIL 3X3 NADH (GAUZE/BANDAGES/DRESSINGS) ×3 IMPLANT
GLOVE BIO SURGEON STRL SZ7 (GLOVE) ×3 IMPLANT
GLOVE BIO SURGEON STRL SZ7.5 (GLOVE) ×3 IMPLANT
GLOVE BIOGEL PI IND STRL 7.0 (GLOVE) ×1 IMPLANT
GLOVE BIOGEL PI IND STRL 7.5 (GLOVE) ×1 IMPLANT
GLOVE BIOGEL PI IND STRL 8 (GLOVE) ×1 IMPLANT
GLOVE BIOGEL PI INDICATOR 7.0 (GLOVE) ×2
GLOVE BIOGEL PI INDICATOR 7.5 (GLOVE) ×2
GLOVE BIOGEL PI INDICATOR 8 (GLOVE) ×2
GLOVE ECLIPSE 6.5 STRL STRAW (GLOVE) ×3 IMPLANT
GOWN STRL REUS W/ TWL LRG LVL3 (GOWN DISPOSABLE) ×2 IMPLANT
GOWN STRL REUS W/TWL LRG LVL3 (GOWN DISPOSABLE) ×4
GOWN STRL REUS W/TWL XL LVL3 (GOWN DISPOSABLE) ×3 IMPLANT
NEEDLE HYPO 25X1 1.5 SAFETY (NEEDLE) IMPLANT
NS IRRIG 1000ML POUR BTL (IV SOLUTION) ×3 IMPLANT
PACK BASIN DAY SURGERY FS (CUSTOM PROCEDURE TRAY) ×3 IMPLANT
PADDING CAST ABS 4INX4YD NS (CAST SUPPLIES)
PADDING CAST ABS COTTON 4X4 ST (CAST SUPPLIES) IMPLANT
SPONGE GAUZE 4X4 12PLY STER LF (GAUZE/BANDAGES/DRESSINGS) ×3 IMPLANT
STOCKINETTE 6  STRL (DRAPES) ×2
STOCKINETTE 6 STRL (DRAPES) ×1 IMPLANT
SUT VICRYL RAPIDE 4-0 (SUTURE) ×3 IMPLANT
SYR BULB 3OZ (MISCELLANEOUS) ×3 IMPLANT
SYRINGE 10CC LL (SYRINGE) IMPLANT
TOWEL OR 17X24 6PK STRL BLUE (TOWEL DISPOSABLE) ×6 IMPLANT
TOWEL OR NON WOVEN STRL DISP B (DISPOSABLE) ×3 IMPLANT
UNDERPAD 30X30 INCONTINENT (UNDERPADS AND DIAPERS) ×3 IMPLANT

## 2014-08-11 NOTE — Interval H&P Note (Signed)
History and Physical Interval Note:  08/11/2014 11:01 AM  Kristy Underwood  has presented today for surgery, with the diagnosis of RIGHT CARPAL TUNNEL SYNDROME  The various methods of treatment have been discussed with the patient and family. After consideration of risks, benefits and other options for treatment, the patient has consented to  Procedure(s): RIGHT CARPAL TUNNEL RELEASE ENDOSCOPIC (Right) as a surgical intervention .  The patient's history has been reviewed, patient examined, no change in status, stable for surgery.  I have reviewed the patient's chart and labs.  Questions were answered to the patient's satisfaction.     Delisa Finck A.

## 2014-08-11 NOTE — Op Note (Signed)
08/11/2014  11:01 AM  PATIENT:  Devona KonigPhuoc T Bow  49 y.o. female  PRE-OPERATIVE DIAGNOSIS:  RIGHT CARPAL TUNNEL SYNDROME  POST-OPERATIVE DIAGNOSIS:  Same  PROCEDURE:  Procedure(s): RIGHT CARPAL TUNNEL RELEASE ENDOSCOPIC  SURGEON:  Surgeon(s): Mack Hookavid Zavon Hyson, MD  PHYSICIAN ASSISTANT: Danielle RankinKirsten Schrader, OPA-C  ANESTHESIA:  Local / MAC  SPECIMENS:  None  DRAINS:   None  EBL:  less than 50 mL  PREOPERATIVE INDICATIONS:  Tymeka T Oda CoganHoang is a  49 y.o. female with a diagnosis of RIGHT CARPAL TUNNEL SYNDROME who failed conservative measures and elected for surgical management.    The risks benefits and alternatives were discussed with the patient preoperatively including but not limited to the risks of infection, bleeding, nerve injury, cardiopulmonary complications, the need for revision surgery, among others, and the patient verbalized understanding and consented to proceed.  OPERATIVE IMPLANTS: None  OPERATIVE FINDINGS: Tight carpal tunnel, acceptably decompressed following release  OPERATIVE PROCEDURE:  After receiving prophylactic antibiotics, the patient was escorted to the operative theatre and placed in a supine position.   A surgical "time-out" was performed during which the planned procedure, proposed operative site, and the correct patient identity were compared to the operative consent and agreement confirmed by the circulating nurse according to current facility policy.  Following application of a tourniquet to the operative extremity, the planned incisions were marked and anesthetized with a mixture of lidocaine & bupivicaine containing epinephrine.  The exposed skin was prepped with Chloraprep and draped in the usual sterile fashion.  The limb was exsanguinated with an Esmarch bandage and the tourniquet inflated to approximately 100mmHg higher than systolic BP.   The incisions were made sharply. Subcutaneous tissues were dissected with blunt and spreading dissection. At the  proximal incision, the deep forearm fascia was split in line with the skin incision the distal edge grasped with a hemostat. At the mid palmar incision, the palmar fascia was split in line with the skin incision, revealing the underlying superficial palmar arch which was visualized with loupe assisted magnification. The synovial reflector was then introduced into the proximal incision and passed through the carpal canal, uses to reflect synovium from the deep surface of the transverse carpal ligament. It was removed and replaced with the slotted cannula and blunt obturator, passing from proximal to distal and exiting the distal wound superficial to the superficial palmar arch. The obturator was removed and the camera inserted. Visualization of the ligament was acceptable. The triangle shape blade was then inserted distally, advanced to the midportion of the ligament, and there used to create a perforation in the ligament. This instrument was removed and the hooked nstrument was inserted. It was placed into the perforation in the ligament and withdrawn distally, completing transection of the distal half of the ligament. The camera was then removed and placed into the distal end of the cannula. The hooked instrument was placed into the proximal end and advanced facility to be placed into the apex of the V. which had been formed to the distal hemi-transection of the ligament. It was withdrawn proximally, completing transection of the ligament. The adequacy of the release was judged with the scope and the instruments used as a probe. All of the endoscopic instruments are removed and the adequacy of release was again judged from the proximal incision perspective with direct loupe assisted visualization. In addition, the proximal forearm fascia was split for 2 inches proximal to the proximal incision under direct visualization using a sliding scissor technique. The tourniquet was  released, the wound copiously irrigated. A  TLS drain was placed to lie alongside the nerve exiting its own skin hole distally made with some sharp trocar. The skin was then closed with 4-0 Vicryl Rapide interrupted sutures. A light dressing was applied and the balance of 10 mL of the initial anesthetic mixture was placed down the drain, and the drain was clamped to be unclamped in the recovery room.  DISPOSITION:  The patient will be discharged home today, returning in 10-15 days for re-assessment.

## 2014-08-11 NOTE — Anesthesia Postprocedure Evaluation (Signed)
  Anesthesia Post-op Note  Patient: Kristy Underwood  Procedure(s) Performed: Procedure(s): RIGHT CARPAL TUNNEL RELEASE ENDOSCOPIC (Right)  Patient Location: PACU  Anesthesia Type: MAC   Level of Consciousness: awake, alert  and oriented  Airway and Oxygen Therapy: Patient Spontanous Breathing  Post-op Pain: mild  Post-op Assessment: Post-op Vital signs reviewed  Post-op Vital Signs: Reviewed  Last Vitals:  Filed Vitals:   08/11/14 1241  BP: 138/84  Pulse: 85  Temp: 36.7 C  Resp: 18    Complications: No apparent anesthesia complications

## 2014-08-11 NOTE — Discharge Instructions (Addendum)
Discharge Instructions ° ° °You have a light dressing on your hand.  °You may begin gentle motion of your fingers and hand immediately, but you should not do any heavy lifting or gripping.  Elevate your hand to reduce pain & swelling of the digits.  Ice over the operative site may be helpful to reduce pain & swelling.  DO NOT USE HEAT. °Pain medicine has been prescribed for you.  °Use your medicine as needed over the first 48 hours, and then you can begin to taper your use. You may use Tylenol in place of your prescribed pain medication, but not IN ADDITION to it. °Leave the dressing in place until the third day after your surgery and then remove it, leaving it open to air.  °After the bandage has been removed you may shower, but do not soak the incision.  °You may drive a car when you are off of prescription pain medications and can safely control your vehicle with both hands. °We will address whether therapy will be required or not when you return to the office. °You may have already made your follow-up appointment when we completed your preop visit.  If not, please call our office today or the next business day to make your return appointment for 10-15 days after surgery. ° ° °Please call [phone] during normal business hours or [phone] after hours for any problems. Including the following: ° °- excessive redness of the incisions °- drainage for more than 4 days °- fever of more than 101.5 F ° °*Please note that pain medications will not be refilled after hours or on weekends. ° ° °TLS Drain Instructions °You have a drain tube in place to help limit the amount of blood that collects under your skin in the early post-operative period.  The amount it drains will vary from person-to-person and is dependent upon many factors.  You will have 1-2 extra drain tubes sent with you from the facility.  You should change the tube according to these instructions below when the tube is about half full. ° °BE SURE TO  SLIDE THE CLAMP TO THE CLOSED POSITION BEFORE CHANGING THE TUBE.   ° °RE-OPEN THE CLAMP ONCE THE GLASS EVACUATION TUBE HAS BEEN CHANGED. ° °24 hours after surgery the amount of drainage will likely be negligible and it will be time to remove the tube and discard it.  Simply remove the tape that secures the flexible plastic drainage tube to the bandage and briskly pull the tube straight out.  You will likely feel a little discomfort during this process, but the tube should slide out as it is not sewn or otherwise secured directly to your body.  It is merely held in place by the bandage itself.  °                           ° °If you are not clear about when your first post-op appointment is scheduled, please call the office on the next business day to inquire about it.  Please also call the office if you have any other questions ([phone]).   ° °Post Anesthesia Home Care Instructions ° °Activity: °Get plenty of rest for the remainder of the day. A responsible adult should stay with you for 24 hours following the procedure.  °For the next 24 hours, DO NOT: °-Drive a car °-Operate machinery °-Drink alcoholic beverages °-Take any medication unless instructed by your physician °-Make any legal decisions or   sign important papers. ° °Meals: °Start with liquid foods such as gelatin or soup. Progress to regular foods as tolerated. Avoid greasy, spicy, heavy foods. If nausea and/or vomiting occur, drink only clear liquids until the nausea and/or vomiting subsides. Call your physician if vomiting continues. ° °Special Instructions/Symptoms: °Your throat may feel dry or sore from the anesthesia or the breathing tube placed in your throat during surgery. If this causes discomfort, gargle with warm salt water. The discomfort should disappear within 24 hours. ° °

## 2014-08-11 NOTE — Transfer of Care (Signed)
Immediate Anesthesia Transfer of Care Note  Patient: Kristy Underwood  Procedure(s) Performed: Procedure(s): RIGHT CARPAL TUNNEL RELEASE ENDOSCOPIC (Right)  Patient Location: PACU  Anesthesia Type:MAC  Level of Consciousness: awake, alert  and oriented  Airway & Oxygen Therapy: Patient Spontanous Breathing and Patient connected to face mask oxygen  Post-op Assessment: Report given to RN and Post -op Vital signs reviewed and stable  Post vital signs: Reviewed and stable  Last Vitals:  Filed Vitals:   08/11/14 1028  BP: 131/81  Pulse: 84  Temp: 36.8 C  Resp: 16    Complications: No apparent anesthesia complications

## 2014-08-11 NOTE — Anesthesia Preprocedure Evaluation (Signed)
Anesthesia Evaluation  Patient identified by MRN, date of birth, ID band Patient awake    Reviewed: Allergy & Precautions, NPO status , Patient's Chart, lab work & pertinent test results  Airway Mallampati: I  TM Distance: >3 FB Neck ROM: Full    Dental  (+) Teeth Intact, Dental Advisory Given   Pulmonary  breath sounds clear to auscultation        Cardiovascular Rhythm:Regular Rate:Normal     Neuro/Psych    GI/Hepatic GERD-  Medicated and Controlled,  Endo/Other    Renal/GU      Musculoskeletal   Abdominal   Peds  Hematology   Anesthesia Other Findings   Reproductive/Obstetrics                             Anesthesia Physical Anesthesia Plan  ASA: I  Anesthesia Plan: MAC   Post-op Pain Management:    Induction: Intravenous  Airway Management Planned: Simple Face Mask  Additional Equipment:   Intra-op Plan:   Post-operative Plan:   Informed Consent: I have reviewed the patients History and Physical, chart, labs and discussed the procedure including the risks, benefits and alternatives for the proposed anesthesia with the patient or authorized representative who has indicated his/her understanding and acceptance.   Dental advisory given  Plan Discussed with: Anesthesiologist, CRNA and Surgeon  Anesthesia Plan Comments:         Anesthesia Quick Evaluation

## 2014-08-11 NOTE — Anesthesia Procedure Notes (Signed)
Procedure Name: MAC Date/Time: 08/11/2014 11:10 AM Performed by: Zenia ResidesPAYNE, Leilani Cespedes D Pre-anesthesia Checklist: Patient identified, Timeout performed, Emergency Drugs available, Suction available and Patient being monitored Oxygen Delivery Method: Simple face mask

## 2014-08-13 ENCOUNTER — Encounter (HOSPITAL_BASED_OUTPATIENT_CLINIC_OR_DEPARTMENT_OTHER): Payer: Self-pay | Admitting: Orthopedic Surgery

## 2015-01-15 ENCOUNTER — Other Ambulatory Visit: Payer: Self-pay | Admitting: Orthopedic Surgery

## 2015-01-15 NOTE — H&P (Signed)
Kristy Underwood is an 49 y.o. female.   CC / Reason for Visit: Status post right ECTR; left carpal tunnel syndrome; left index finger triggering HPI: This patient returns for her three-month evaluation of her right ECTR; doing very well and is very pleased with the outcome.  Today she reports that she would like to schedule the left side carpal tunnel release and also is been having pain into her left index finger.   Past Medical History  Diagnosis Date  . GERD (gastroesophageal reflux disease)   . Carpal tunnel syndrome     Past Surgical History  Procedure Laterality Date  . Cesarean section      x2  . Carpal tunnel release Right 08/11/2014    Procedure: RIGHT CARPAL TUNNEL RELEASE ENDOSCOPIC;  Surgeon: Mack Hook, MD;  Location:  [facility];  Service: Orthopedics;  Laterality: Right;    Family History  Problem Relation Age of Onset  . Heart disease Father   . Heart disease Paternal Grandfather    Social History:  reports that she has never smoked. She has never used smokeless tobacco. She reports that she does not drink alcohol or use illicit drugs.  Allergies: No Known Allergies  No prescriptions prior to admission    No results found for this or any previous visit (from the past 48 hour(s)). No results found.  Review of Systems  All other systems reviewed and are negative.   There were no vitals taken for this visit. Physical Exam  Constitutional:  WD, WN, NAD HEENT:  NCAT, EOMI Neuro/Psych:  Alert & oriented to person, place, and time; appropriate mood & affect Lymphatic: No generalized UE edema or lymphadenopathy Extremities / MSK:  Both UE are normal with respect to appearance, ranges of motion, joint stability, muscle strength/tone, sensation, & perfusion except as otherwise noted:  The right hand is not swollen with matured incisions and no sign of infection.  Full digital range of motion, and NVI.  Grip strength position 2 right 65 left 40.   Monofilament 2.83 on the right thumb through small. The left hand continues to have symptoms of severe carpal tunnel syndrome including numbness and tingling into the thumb index long and half the ring.  There is pain with palpation of the A1 pulley and some inducible locking with flexion cycle.  Labs / Xrays:  No radiographic studies obtained today.  Assessment:  1. S/P R ECTR 2. L CTS 3. L IF trigger   Plan: The findings were discussed with the patient.  It was determined at this time that she would like to schedule for her left endoscopic carpal tunnel release as well as her left index finger trigger release.The details of the operative procedure were discussed with the patient.  Questions were invited and answered.  In addition to the goal of the procedure, the risks of the procedure to include but not limited to bleeding; infection; damage to the nerves or blood vessels that could result in bleeding, numbness, weakness, chronic pain, and the need for additional procedures; stiffness; the need for revision surgery; and anesthetic risks were reviewed.  No specific outcome was guaranteed or implied.  Informed consent was obtained.   [name] A. 01/15/2015, 3:43 PM

## 2015-01-20 ENCOUNTER — Encounter (HOSPITAL_BASED_OUTPATIENT_CLINIC_OR_DEPARTMENT_OTHER)
Admission: RE | Disposition: A | Discharge status: Home / Self Care | Payer: Self-pay | Source: Ambulatory Visit | Attending: Orthopedic Surgery

## 2015-01-20 ENCOUNTER — Ambulatory Visit (HOSPITAL_BASED_OUTPATIENT_CLINIC_OR_DEPARTMENT_OTHER): Payer: Medicaid Other | Admitting: Certified Registered"

## 2015-01-20 ENCOUNTER — Encounter (HOSPITAL_BASED_OUTPATIENT_CLINIC_OR_DEPARTMENT_OTHER): Payer: Self-pay | Admitting: Certified Registered"

## 2015-01-20 ENCOUNTER — Ambulatory Visit (HOSPITAL_BASED_OUTPATIENT_CLINIC_OR_DEPARTMENT_OTHER)
Admission: RE | Admit: 2015-01-20 | Discharge: 2015-01-20 | Disposition: A | Discharge status: Home / Self Care | Payer: Medicaid Other | Source: Ambulatory Visit | Attending: Orthopedic Surgery | Admitting: Orthopedic Surgery

## 2015-01-20 DIAGNOSIS — K219 Gastro-esophageal reflux disease without esophagitis: Secondary | ICD-10-CM | POA: Diagnosis not present

## 2015-01-20 DIAGNOSIS — G5602 Carpal tunnel syndrome, left upper limb: Secondary | ICD-10-CM | POA: Diagnosis not present

## 2015-01-20 DIAGNOSIS — M65322 Trigger finger, left index finger: Secondary | ICD-10-CM | POA: Diagnosis not present

## 2015-01-20 HISTORY — PX: TRIGGER FINGER RELEASE: SHX641

## 2015-01-20 HISTORY — PX: CARPAL TUNNEL RELEASE: SHX101

## 2015-01-20 LAB — POCT HEMOGLOBIN-HEMACUE: HEMOGLOBIN: 15.6 g/dL — AB (ref 12.0–15.0)

## 2015-01-20 SURGERY — RELEASE, CARPAL TUNNEL, ENDOSCOPIC
Anesthesia: Monitor Anesthesia Care | Site: Hand | Laterality: Left

## 2015-01-20 MED ORDER — ONDANSETRON HCL 4 MG/2ML IJ SOLN
INTRAMUSCULAR | Status: DC | PRN
  Administered 2015-01-20: 4 mg via INTRAVENOUS

## 2015-01-20 MED ORDER — LIDOCAINE HCL (CARDIAC) 20 MG/ML IV SOLN
INTRAVENOUS | Status: DC | PRN
  Administered 2015-01-20: 50 mg via INTRAVENOUS

## 2015-01-20 MED ORDER — FENTANYL CITRATE (PF) 100 MCG/2ML IJ SOLN: 25.0000 ug | INTRAMUSCULAR | Status: DC | PRN

## 2015-01-20 MED ORDER — BUPIVACAINE-EPINEPHRINE 0.5% -1:200000 IJ SOLN
INTRAMUSCULAR | Status: DC | PRN
  Administered 2015-01-20: 10 mL

## 2015-01-20 MED ORDER — CEFAZOLIN SODIUM-DEXTROSE 2-3 GM-% IV SOLR
2.0000 g | INTRAVENOUS | Status: AC
  Administered 2015-01-20: 2 g via INTRAVENOUS

## 2015-01-20 MED ORDER — LACTATED RINGERS IV SOLN: INTRAVENOUS | Status: DC

## 2015-01-20 MED ORDER — GLYCOPYRROLATE 0.2 MG/ML IJ SOLN: 0.2000 mg | Freq: Once | INTRAMUSCULAR | Status: DC | PRN

## 2015-01-20 MED ORDER — CEFAZOLIN SODIUM-DEXTROSE 2-3 GM-% IV SOLR
INTRAVENOUS | Status: AC
  Filled 2015-01-20: qty 50

## 2015-01-20 MED ORDER — EPHEDRINE SULFATE 50 MG/ML IJ SOLN
INTRAMUSCULAR | Status: DC | PRN
  Administered 2015-01-20: 10 mg via INTRAVENOUS

## 2015-01-20 MED ORDER — LIDOCAINE HCL 2 % IJ SOLN
INTRAMUSCULAR | Status: AC
  Filled 2015-01-20: qty 20

## 2015-01-20 MED ORDER — MEPERIDINE HCL 25 MG/ML IJ SOLN: 6.2500 mg | INTRAMUSCULAR | Status: DC | PRN

## 2015-01-20 MED ORDER — MIDAZOLAM HCL 2 MG/2ML IJ SOLN
1.0000 mg | INTRAMUSCULAR | Status: DC | PRN
  Administered 2015-01-20: 1 mg via INTRAVENOUS

## 2015-01-20 MED ORDER — LACTATED RINGERS IV SOLN
INTRAVENOUS | Status: DC
  Administered 2015-01-20: 12:00:00 via INTRAVENOUS

## 2015-01-20 MED ORDER — SCOPOLAMINE 1 MG/3DAYS TD PT72: 1.0000 | MEDICATED_PATCH | Freq: Once | TRANSDERMAL | Status: DC | PRN

## 2015-01-20 MED ORDER — LIDOCAINE HCL 2 % IJ SOLN
INTRAMUSCULAR | Status: DC | PRN
  Administered 2015-01-20: 10 mL

## 2015-01-20 MED ORDER — FENTANYL CITRATE (PF) 100 MCG/2ML IJ SOLN: 50.0000 ug | INTRAMUSCULAR | Status: DC | PRN

## 2015-01-20 MED ORDER — MIDAZOLAM HCL 2 MG/2ML IJ SOLN
INTRAMUSCULAR | Status: AC
  Filled 2015-01-20: qty 2

## 2015-01-20 MED ORDER — FENTANYL CITRATE (PF) 100 MCG/2ML IJ SOLN
INTRAMUSCULAR | Status: AC
  Filled 2015-01-20: qty 2

## 2015-01-20 MED ORDER — PROPOFOL INFUSION 10 MG/ML OPTIME
INTRAVENOUS | Status: DC | PRN
  Administered 2015-01-20: 75 ug/kg/min via INTRAVENOUS

## 2015-01-20 MED ORDER — BUPIVACAINE-EPINEPHRINE (PF) 0.5% -1:200000 IJ SOLN
INTRAMUSCULAR | Status: AC
  Filled 2015-01-20: qty 30

## 2015-01-20 SURGICAL SUPPLY — 47 items
APPLICATOR COTTON TIP 6IN STRL (MISCELLANEOUS) ×4 IMPLANT
BLADE HOOK ENDO STRL (BLADE) ×4 IMPLANT
BLADE MINI RND TIP GREEN BEAV (BLADE) IMPLANT
BLADE SURG 15 STRL LF DISP TIS (BLADE) ×2 IMPLANT
BLADE SURG 15 STRL SS (BLADE) ×2
BLADE TRIANGLE EPF/EGR ENDO (BLADE) ×4 IMPLANT
BNDG COHESIVE 1X5 TAN STRL LF (GAUZE/BANDAGES/DRESSINGS) ×4 IMPLANT
BNDG COHESIVE 4X5 TAN STRL (GAUZE/BANDAGES/DRESSINGS) ×4 IMPLANT
BNDG CONFORM 2 STRL LF (GAUZE/BANDAGES/DRESSINGS) ×4 IMPLANT
BNDG ESMARK 4X9 LF (GAUZE/BANDAGES/DRESSINGS) ×4 IMPLANT
BNDG GAUZE ELAST 4 BULKY (GAUZE/BANDAGES/DRESSINGS) ×4 IMPLANT
CHLORAPREP W/TINT 26ML (MISCELLANEOUS) ×4 IMPLANT
CORDS BIPOLAR (ELECTRODE) ×4 IMPLANT
COVER BACK TABLE 60X90IN (DRAPES) ×4 IMPLANT
COVER MAYO STAND STRL (DRAPES) ×4 IMPLANT
CUFF TOURNIQUET SINGLE 18IN (TOURNIQUET CUFF) ×4 IMPLANT
DRAIN TLS ROUND 10FR (DRAIN) ×4 IMPLANT
DRAPE EXTREMITY T 121X128X90 (DRAPE) ×4 IMPLANT
DRAPE SURG 17X23 STRL (DRAPES) ×4 IMPLANT
DRSG EMULSION OIL 3X3 NADH (GAUZE/BANDAGES/DRESSINGS) ×4 IMPLANT
GLOVE BIO SURGEON STRL SZ7.5 (GLOVE) ×4 IMPLANT
GLOVE BIOGEL PI IND STRL 7.0 (GLOVE) ×4 IMPLANT
GLOVE BIOGEL PI IND STRL 8 (GLOVE) ×2 IMPLANT
GLOVE BIOGEL PI INDICATOR 7.0 (GLOVE) ×4
GLOVE BIOGEL PI INDICATOR 8 (GLOVE) ×2
GLOVE ECLIPSE 6.5 STRL STRAW (GLOVE) ×8 IMPLANT
GLOVE EXAM NITRILE EXT CUFF MD (GLOVE) ×4 IMPLANT
GOWN STRL REUS W/ TWL LRG LVL3 (GOWN DISPOSABLE) ×4 IMPLANT
GOWN STRL REUS W/TWL LRG LVL3 (GOWN DISPOSABLE) ×4
GOWN STRL REUS W/TWL XL LVL3 (GOWN DISPOSABLE) ×4 IMPLANT
NDL SAFETY ECLIPSE 18X1.5 (NEEDLE) ×2 IMPLANT
NEEDLE HYPO 18GX1.5 SHARP (NEEDLE) ×2
NEEDLE HYPO 25X1 1.5 SAFETY (NEEDLE) ×4 IMPLANT
NS IRRIG 1000ML POUR BTL (IV SOLUTION) ×4 IMPLANT
PACK BASIN DAY SURGERY FS (CUSTOM PROCEDURE TRAY) ×4 IMPLANT
PADDING CAST ABS 4INX4YD NS (CAST SUPPLIES) ×2
PADDING CAST ABS COTTON 4X4 ST (CAST SUPPLIES) ×2 IMPLANT
SPONGE GAUZE 4X4 12PLY STER LF (GAUZE/BANDAGES/DRESSINGS) ×4 IMPLANT
STOCKINETTE 6  STRL (DRAPES) ×2
STOCKINETTE 6 STRL (DRAPES) ×2 IMPLANT
SUT VICRYL RAPIDE 4-0 (SUTURE) ×4 IMPLANT
SUT VICRYL RAPIDE 4/0 PS 2 (SUTURE) IMPLANT
SYR BULB 3OZ (MISCELLANEOUS) ×4 IMPLANT
SYRINGE 10CC LL (SYRINGE) ×4 IMPLANT
TOWEL OR 17X24 6PK STRL BLUE (TOWEL DISPOSABLE) ×8 IMPLANT
TOWEL OR NON WOVEN STRL DISP B (DISPOSABLE) IMPLANT
UNDERPAD 30X30 (UNDERPADS AND DIAPERS) ×4 IMPLANT

## 2015-01-20 NOTE — Anesthesia Postprocedure Evaluation (Signed)
  Anesthesia Post-op Note  Patient: Kristy Underwood  Procedure(s) Performed: Procedure(s): LEFT ENDOSCOPIC CARPAL TUNNEL RELEASE (Left) LEFT INDEX FINGER TRIGGER RELEASE (Left)  Patient Location: PACU  Anesthesia Type: No value filed.   Level of Consciousness: awake, alert  and oriented  Airway and Oxygen Therapy: Patient Spontanous Breathing  Post-op Pain: none  Post-op Assessment: Post-op Vital signs reviewed  Post-op Vital Signs: Reviewed  Last Vitals:  Filed Vitals:   01/20/15 1354  BP: 128/78  Pulse: 78  Temp: 36.6 C  Resp: 20    Complications: No apparent anesthesia complications

## 2015-01-20 NOTE — Transfer of Care (Signed)
Immediate Anesthesia Transfer of Care Note  Patient: Kristy Underwood  Procedure(s) Performed: Procedure(s): LEFT ENDOSCOPIC CARPAL TUNNEL RELEASE (Left) LEFT INDEX FINGER TRIGGER RELEASE (Left)  Patient Location: PACU  Anesthesia Type:MAC  Level of Consciousness: awake, alert , oriented and patient cooperative  Airway & Oxygen Therapy: Patient Spontanous Breathing and Patient connected to face mask oxygen  Post-op Assessment: Report given to RN and Post -op Vital signs reviewed and stable  Post vital signs: Reviewed and stable  Last Vitals:  Filed Vitals:   01/20/15 1136  BP: 120/99  Pulse: 80  Temp: 36.7 C  Resp: 16    Complications: No apparent anesthesia complications

## 2015-01-20 NOTE — Discharge Instructions (Signed)
Discharge Instructions ° ° °You have a light dressing on your hand.  °You may begin gentle motion of your fingers and hand immediately, but you should not do any heavy lifting or gripping.  Elevate your hand to reduce pain & swelling of the digits.  Ice over the operative site may be helpful to reduce pain & swelling.  DO NOT USE HEAT. °Pain medicine has been prescribed for you.  °Use your medicine as needed over the first 48 hours, and then you can begin to taper your use. You may use Tylenol in place of your prescribed pain medication, but not IN ADDITION to it. °Leave the dressing in place until the third day after your surgery and then remove it, leaving it open to air.  °After the bandage has been removed you may shower, but do not soak the incision.  °You may drive a car when you are off of prescription pain medications and can safely control your vehicle with both hands. °We will address whether therapy will be required or not when you return to the office. °You may have already made your follow-up appointment when we completed your preop visit.  If not, please call our office today or the next business day to make your return appointment for 10-15 days after surgery. ° ° °Please call [phone] during normal business hours or [phone] after hours for any problems. Including the following: ° °- excessive redness of the incisions °- drainage for more than 4 days °- fever of more than 101.5 F ° °*Please note that pain medications will not be refilled after hours or on weekends. ° ° °TLS Drain Instructions °You have a drain tube in place to help limit the amount of blood that collects under your skin in the early post-operative period.  The amount it drains will vary from person-to-person and is dependent upon many factors.  You will have 1-2 extra drain tubes sent with you from the facility.  You should change the tube according to these instructions below when the tube is about half full. ° °BE SURE TO  SLIDE THE CLAMP TO THE CLOSED POSITION BEFORE CHANGING THE TUBE.   ° °RE-OPEN THE CLAMP ONCE THE GLASS EVACUATION TUBE HAS BEEN CHANGED. ° °24 hours after surgery the amount of drainage will likely be negligible and it will be time to remove the tube and discard it.  Simply remove the tape that secures the flexible plastic drainage tube to the bandage and briskly pull the tube straight out.  You will likely feel a little discomfort during this process, but the tube should slide out as it is not sewn or otherwise secured directly to your body.  It is merely held in place by the bandage itself.  °                           ° °If you are not clear about when your first post-op appointment is scheduled, please call the office on the next business day to inquire about it.  Please also call the office if you have any other questions ([phone]).   ° ° ° °Post Anesthesia Home Care Instructions ° °Activity: °Get plenty of rest for the remainder of the day. A responsible adult should stay with you for 24 hours following the procedure.  °For the next 24 hours, DO NOT: °-Drive a car °-Operate machinery °-Drink alcoholic beverages °-Take any medication unless instructed by your physician °-Make any legal   decisions or sign important papers. ° °Meals: °Start with liquid foods such as gelatin or soup. Progress to regular foods as tolerated. Avoid greasy, spicy, heavy foods. If nausea and/or vomiting occur, drink only clear liquids until the nausea and/or vomiting subsides. Call your physician if vomiting continues. ° °Special Instructions/Symptoms: °Your throat may feel dry or sore from the anesthesia or the breathing tube placed in your throat during surgery. If this causes discomfort, gargle with warm salt water. The discomfort should disappear within 24 hours. ° °If you had a scopolamine patch placed behind your ear for the management of post- operative nausea and/or vomiting: ° °1. The medication in the patch is effective  for 72 hours, after which it should be removed.  Wrap patch in a tissue and discard in the trash. Wash hands thoroughly with soap and water. °2. You may remove the patch earlier than 72 hours if you experience unpleasant side effects which may include dry mouth, dizziness or visual disturbances. °3. Avoid touching the patch. Wash your hands with soap and water after contact with the patch. °  ° °

## 2015-01-20 NOTE — Interval H&P Note (Signed)
History and Physical Interval Note:  01/20/2015 12:35 PM  Kristy Underwood  has presented today for surgery, with the diagnosis of LEFT CARPAL TUNNEL SYNDROME; LT. INDEX TRIGGER FINGER  The various methods of treatment have been discussed with the patient and family. After consideration of risks, benefits and other options for treatment, the patient has consented to  Procedure(s): LEFT ENDOSCOPIC CARPAL TUNNEL RELEASE (Left) LEFT INDEX FINGER TRIGGER RELEASE (Left) as a surgical intervention .  The patient's history has been reviewed, patient examined, no change in status, stable for surgery.  I have reviewed the patient's chart and labs.  Questions were answered to the patient's satisfaction.     [name] A.

## 2015-01-20 NOTE — Anesthesia Preprocedure Evaluation (Addendum)
Anesthesia Evaluation  Patient identified by MRN, date of birth, ID band Patient awake    Reviewed: Allergy & Precautions, NPO status , Patient's Chart, lab work & pertinent test results  Airway Mallampati: I  TM Distance: >3 FB Neck ROM: Full    Dental  (+) Teeth Intact, Dental Advisory Given   Pulmonary  breath sounds clear to auscultation        Cardiovascular Rhythm:Regular Rate:Normal     Neuro/Psych    GI/Hepatic GERD-  Medicated and Controlled,  Endo/Other    Renal/GU      Musculoskeletal   Abdominal   Peds  Hematology   Anesthesia Other Findings   Reproductive/Obstetrics                            Anesthesia Physical Anesthesia Plan  ASA: II  Anesthesia Plan: MAC   Post-op Pain Management:    Induction: Intravenous  Airway Management Planned: Simple Face Mask  Additional Equipment:   Intra-op Plan:   Post-operative Plan:   Informed Consent: I have reviewed the patients History and Physical, chart, labs and discussed the procedure including the risks, benefits and alternatives for the proposed anesthesia with the patient or authorized representative who has indicated his/her understanding and acceptance.     Plan Discussed with: CRNA, Anesthesiologist and Surgeon  Anesthesia Plan Comments:         Anesthesia Quick Evaluation

## 2015-01-20 NOTE — Op Note (Signed)
01/20/2015  1:13 PM  PATIENT:  Kristy Underwood  49 y.o. female  PRE-OPERATIVE DIAGNOSIS:  LEFT CARPAL TUNNEL SYNDROME; LT. INDEX TRIGGER FINGER  POST-OPERATIVE DIAGNOSIS:  Same  PROCEDURE:  Procedure(s): LEFT ENDOSCOPIC CARPAL TUNNEL RELEASE LEFT INDEX FINGER TRIGGER RELEASE  SURGEON:  Surgeon(s): Mack Hook, MD  PHYSICIAN ASSISTANT: Danielle Rankin, OPA-C  ANESTHESIA:  Local / MAC  SPECIMENS:  None  DRAINS:   TLS x 1  EBL:  less than 50 mL  PREOPERATIVE INDICATIONS:  Kristy Underwood is a  49 y.o. female with a diagnosis of LEFT CARPAL TUNNEL SYNDROME; LT. INDEX TRIGGER FINGER who failed conservative measures and elected for surgical management.    The risks benefits and alternatives were discussed with the patient preoperatively including but not limited to the risks of infection, bleeding, nerve injury, cardiopulmonary complications, the need for revision surgery, among others, and the patient verbalized understanding and consented to proceed.  OPERATIVE IMPLANTS: None  OPERATIVE FINDINGS: Tight carpal tunnel, acceptably decompressed following release  OPERATIVE PROCEDURE:  After receiving prophylactic antibiotics, the patient was escorted to the operative theatre and placed in a supine position.   A surgical "time-out" was performed during which the planned procedure, proposed operative site, and the correct patient identity were compared to the operative consent and agreement confirmed by the circulating nurse according to current facility policy.  Following application of a tourniquet to the operative extremity, the planned incisions were marked and anesthetized with a mixture of lidocaine & bupivicaine containing epinephrine.  The exposed skin was prepped with Chloraprep and draped in the usual sterile fashion.  The limb was exsanguinated with an Esmarch bandage and the tourniquet inflated to approximately higher than systolic BP.   All 3 incisions were made  sharply. The index trigger was addressed first. Spreading dissection was carried down to the flexor tendon sheath and A1 pulley was identified. Digital nerves retracted radially and ulnarly. A1 pulley was incised longitudinally down the midline. The wall was thickened. In addition there were crossing bands proximal to it that were incised. The tendon was pulled into view and found to have thickened tenosynovium on it, which was excisionally debrided. It was then returned to its bed and attention directed to the carpal tunnel release.   Subcutaneous tissues were dissected with blunt and spreading dissection. At the proximal incision, the deep forearm fascia was split in line with the skin incision the distal edge grasped with a hemostat. At the mid palmar incision, the palmar fascia was split in line with the skin incision, revealing the underlying superficial palmar arch which was visualized with loupe assisted magnification. The synovial reflector was then introduced into the proximal incision and passed through the carpal canal, uses to reflect synovium from the deep surface of the transverse carpal ligament. It was removed and replaced with the slotted cannula and blunt obturator, passing from proximal to distal and exiting the distal wound superficial to the superficial palmar arch. The obturator was removed and the camera inserted. Visualization of the ligament was acceptable. The triangle shape blade was then inserted distally, advanced to the midportion of the ligament, and there used to create a perforation in the ligament. This instrument was removed and the hooked nstrument was inserted. It was placed into the perforation in the ligament and withdrawn distally, completing transection of the distal half of the ligament. The camera was then removed and placed into the distal end of the cannula. The hooked instrument was placed into the proximal  end and advanced facility to be placed into the apex of the V.  which had been formed to the distal hemi-transection of the ligament. It was withdrawn proximally, completing transection of the ligament. The adequacy of the release was judged with the scope and the instruments used as a probe. All of the endoscopic instruments are removed and the adequacy of release was again judged from the proximal incision perspective with direct loupe assisted visualization. In addition, the proximal forearm fascia was split for 2 inches proximal to the proximal incision under direct visualization using a sliding scissor technique. The tourniquet was released, the wound copiously irrigated. A TLS drain was placed to lie alongside the nerve exiting its own skin hole distally made with some sharp trocar. The skin was then closed with 4-0 Vicryl Rapide interrupted sutures. A light dressing was applied and the balance of 10 mL of the initial anesthetic mixture was placed down the drain, and the drain was clamped to be unclamped in the recovery room.  DISPOSITION:  The patient will be discharged home today, returning in 10-15 days for re-assessment.

## 2015-01-20 NOTE — Anesthesia Procedure Notes (Signed)
Procedure Name: MAC Date/Time: 01/20/2015 12:36 PM Performed by: Latoya Diskin D Pre-anesthesia Checklist: Patient identified, Emergency Drugs available, Suction available, Patient being monitored and Timeout performed Patient Re-evaluated:Patient Re-evaluated prior to inductionOxygen Delivery Method: Simple face mask

## 2015-01-21 ENCOUNTER — Encounter (HOSPITAL_BASED_OUTPATIENT_CLINIC_OR_DEPARTMENT_OTHER): Payer: Self-pay | Admitting: Orthopedic Surgery

## 2015-08-10 ENCOUNTER — Other Ambulatory Visit: Payer: Self-pay

## 2015-08-10 DIAGNOSIS — Z1231 Encounter for screening mammogram for malignant neoplasm of breast: Secondary | ICD-10-CM

## 2015-08-20 ENCOUNTER — Ambulatory Visit
Admission: RE | Admit: 2015-08-20 | Discharge: 2015-08-20 | Disposition: A | Discharge status: Home / Self Care | Payer: Medicaid Other | Source: Ambulatory Visit

## 2015-08-20 DIAGNOSIS — Z1231 Encounter for screening mammogram for malignant neoplasm of breast: Secondary | ICD-10-CM

## 2016-06-22 ENCOUNTER — Encounter (HOSPITAL_COMMUNITY): Payer: Self-pay | Admitting: Emergency Medicine

## 2016-06-22 ENCOUNTER — Ambulatory Visit (HOSPITAL_COMMUNITY)
Admission: EM | Admit: 2016-06-22 | Discharge: 2016-06-22 | Disposition: A | Discharge status: Home / Self Care | Payer: Medicaid Other | Attending: Family Medicine | Admitting: Family Medicine

## 2016-06-22 DIAGNOSIS — J029 Acute pharyngitis, unspecified: Secondary | ICD-10-CM

## 2016-06-22 MED ORDER — IPRATROPIUM BROMIDE 0.06 % NA SOLN: 2.0000 | Freq: Four times a day (QID) | NASAL | 12 refills | Status: AC

## 2016-06-22 MED ORDER — AZITHROMYCIN 250 MG PO TABS: ORAL_TABLET | ORAL | 0 refills | Status: AC

## 2016-06-22 NOTE — ED Provider Notes (Signed)
[facility]    CSN: [id number] Arrival date & time: 06/22/16  1924     History   Chief Complaint Chief Complaint  Patient presents with  . Sore Throat    HPI Kristy Underwood is a 51 y.o. female.   The history is provided by the patient and a friend.  Sore Throat  This is a new problem. The current episode started more than 2 days ago. The problem has been gradually worsening. Pertinent negatives include no chest pain, no abdominal pain and no shortness of breath. The symptoms are aggravated by swallowing.    Past Medical History:  Diagnosis Date  . Carpal tunnel syndrome   . GERD (gastroesophageal reflux disease)     Patient Active Problem List   Diagnosis Date Noted  . Abdominal pain, epigastric 07/23/2013    Past Surgical History:  Procedure Laterality Date  . CARPAL TUNNEL RELEASE Right 08/11/2014   Procedure: RIGHT CARPAL TUNNEL RELEASE ENDOSCOPIC;  Surgeon: Mack Hook, MD;  Location: [facility];  Service: Orthopedics;  Laterality: Right;  . CARPAL TUNNEL RELEASE Left 01/20/2015   Procedure: LEFT ENDOSCOPIC CARPAL TUNNEL RELEASE;  Surgeon: Mack Hook, MD;  Location: [facility];  Service: Orthopedics;  Laterality: Left;  . CESAREAN SECTION     x2  . TRIGGER FINGER RELEASE Left 01/20/2015   Procedure: LEFT INDEX FINGER TRIGGER RELEASE;  Surgeon: Mack Hook, MD;  Location: [facility];  Service: Orthopedics;  Laterality: Left;    OB History    No data available       Home Medications    Prior to Admission medications   Medication Sig Start Date End Date Taking? Authorizing Provider  Pseudoeph-Doxylamine-DM-APAP (NYQUIL PO) Take by mouth.   Yes Historical Provider, MD  Pseudoephedrine-APAP-DM (DAYQUIL PO) Take by mouth.   Yes Historical Provider, MD  acetaminophen (TYLENOL) 500 MG tablet Take 500 mg by mouth every 6 (six) hours as needed.    Historical Provider, MD  azithromycin (ZITHROMAX Z-PAK)  250 MG tablet Take as directed on pack 06/22/16   Linna Hoff, MD  ipratropium (ATROVENT) 0.06 % nasal spray Place 2 sprays into both nostrils 4 (four) times daily. 06/22/16   Linna Hoff, MD  Leuprolide Acetate (LUPRON DEPOT IM) Inject into the muscle.    Historical Provider, MD  pantoprazole (PROTONIX) 40 MG tablet Take 40 mg by mouth daily.    Historical Provider, MD    Family History Family History  Problem Relation Age of Onset  . Heart disease Father   . Heart disease Paternal Grandfather     Social History Social History  Substance Use Topics  . Smoking status: Never Smoker  . Smokeless tobacco: Never Used  . Alcohol use No     Allergies   Patient has no known allergies.   Review of Systems Review of Systems  Constitutional: Negative.   HENT: Positive for congestion, postnasal drip, rhinorrhea and sore throat.   Respiratory: Negative.  Negative for shortness of breath.   Cardiovascular: Negative.  Negative for chest pain.  Gastrointestinal: Negative.  Negative for abdominal pain.  All other systems reviewed and are negative.    Physical Exam Triage Vital Signs ED Triage Vitals [06/22/16 2005]  Enc Vitals Group     BP 148/87     Pulse Rate 77     Resp 22     Temp 98.6 F (37 C)     Temp Source Oral  SpO2 100 %     Weight      Height      Head Circumference      Peak Flow      Pain Score 7     Pain Loc      Pain Edu?      Excl. in GC?    No data found.   Updated Vital Signs BP 148/87 (BP Location: Left Arm)   Pulse 77   Temp 98.6 F (37 C) (Oral)   Resp 22   SpO2 100%   Visual Acuity Right Eye Distance:   Left Eye Distance:   Bilateral Distance:    Right Eye Near:   Left Eye Near:    Bilateral Near:     Physical Exam  Constitutional: She is oriented to person, place, and time. She appears well-developed and well-nourished.  HENT:  Right Ear: External ear normal.  Left Ear: External ear normal.  Nose: Nose normal.    Mouth/Throat: Oropharynx is clear and moist.  Eyes: Pupils are equal, round, and reactive to light.  Neck: Normal range of motion. Neck supple.  Cardiovascular: Normal rate and regular rhythm.   Pulmonary/Chest: Effort normal and breath sounds normal.  Lymphadenopathy:    She has no cervical adenopathy.  Neurological: She is alert and oriented to person, place, and time.  Skin: Skin is warm and dry.  Nursing note and vitals reviewed.    UC Treatments / Results  Labs (all labs ordered are listed, but only abnormal results are displayed) Labs Reviewed - No data to display  EKG  EKG Interpretation None       Radiology No results found.  Procedures Procedures (including critical care time)  Medications Ordered in UC Medications - No data to display   Initial Impression / Assessment and Plan / UC Course  I have reviewed the triage vital signs and the nursing notes.  Pertinent labs & imaging results that were available during my care of the patient were reviewed by me and considered in my medical decision making (see chart for details).       Final Clinical Impressions(s) / UC Diagnoses   Final diagnoses:  Viral pharyngitis    New Prescriptions New Prescriptions   AZITHROMYCIN (ZITHROMAX Z-PAK) 250 MG TABLET    Take as directed on pack   IPRATROPIUM (ATROVENT) 0.06 % NASAL SPRAY    Place 2 sprays into both nostrils 4 (four) times daily.     Linna HoffJames D Doral Ventrella, MD 06/22/16 2025

## 2016-06-22 NOTE — Discharge Instructions (Signed)
Drink lots of fluids, take all of medicine, use lozenges as needed.return if needed °

## 2016-06-22 NOTE — ED Triage Notes (Signed)
Sore throat, fever and cough for 3 days

## 2016-09-28 ENCOUNTER — Other Ambulatory Visit: Payer: Self-pay | Admitting: Specialist

## 2016-09-28 DIAGNOSIS — Z1231 Encounter for screening mammogram for malignant neoplasm of breast: Secondary | ICD-10-CM

## 2016-10-18 ENCOUNTER — Ambulatory Visit
Admission: RE | Admit: 2016-10-18 | Discharge: 2016-10-18 | Disposition: A | Discharge status: Home / Self Care | Payer: Medicaid Other | Source: Ambulatory Visit | Attending: Specialist | Admitting: Specialist

## 2016-10-18 DIAGNOSIS — Z1231 Encounter for screening mammogram for malignant neoplasm of breast: Secondary | ICD-10-CM

## 2017-07-12 ENCOUNTER — Other Ambulatory Visit: Payer: Self-pay

## 2017-07-12 DIAGNOSIS — Z1231 Encounter for screening mammogram for malignant neoplasm of breast: Secondary | ICD-10-CM

## 2018-02-16 ENCOUNTER — Other Ambulatory Visit: Payer: Self-pay | Admitting: Specialist

## 2018-02-16 DIAGNOSIS — Z1231 Encounter for screening mammogram for malignant neoplasm of breast: Secondary | ICD-10-CM

## 2018-02-26 ENCOUNTER — Other Ambulatory Visit (HOSPITAL_COMMUNITY): Payer: Self-pay | Admitting: *Deleted

## 2018-02-26 DIAGNOSIS — Z1231 Encounter for screening mammogram for malignant neoplasm of breast: Secondary | ICD-10-CM

## 2018-05-03 ENCOUNTER — Ambulatory Visit (HOSPITAL_COMMUNITY)
Admission: RE | Admit: 2018-05-03 | Discharge: 2018-05-03 | Disposition: A | Discharge status: Home / Self Care | Payer: Medicaid Other | Source: Ambulatory Visit | Attending: Obstetrics and Gynecology | Admitting: Obstetrics and Gynecology

## 2018-05-03 ENCOUNTER — Ambulatory Visit
Admission: RE | Admit: 2018-05-03 | Discharge: 2018-05-03 | Disposition: A | Discharge status: Home / Self Care | Payer: No Typology Code available for payment source | Source: Ambulatory Visit | Attending: Obstetrics and Gynecology | Admitting: Obstetrics and Gynecology

## 2018-05-03 ENCOUNTER — Encounter (HOSPITAL_COMMUNITY): Payer: Self-pay | Admitting: *Deleted

## 2018-05-03 ENCOUNTER — Encounter (HOSPITAL_COMMUNITY): Payer: Self-pay

## 2018-05-03 VITALS — BP 126/72 | Ht 62.0 in | Wt 147.0 lb

## 2018-05-03 DIAGNOSIS — Z01419 Encounter for gynecological examination (general) (routine) without abnormal findings: Secondary | ICD-10-CM

## 2018-05-03 DIAGNOSIS — Z1231 Encounter for screening mammogram for malignant neoplasm of breast: Secondary | ICD-10-CM

## 2018-05-03 NOTE — Patient Instructions (Signed)
Explained breast self awareness with [name]. Let patient know [facility] will cover Pap smears and HPV typing every 5 years unless has a history of abnormal Pap smears. [facility]. Appointment scheduled for Thursday, May 03, 2018 at 1310. Patient aware of appointment and will be there. Let patient know will follow up with her within the next couple weeks with results of Pap smear by letter or phone. Informed patient that the Breast Center will follow-up with her within the next couple of weeks with results of mammogram by letter or phone. Kristy Underwood verbalized understanding.  [name], Kathaleen Maserhristine Poll, RN 12:28 PM

## 2018-05-03 NOTE — Progress Notes (Signed)
No complaints today.   Pap Smear: Pap smear completed today. Last Pap smear was 03/20/2006 and normal. Per patient has no history of an abnormal Pap smear. Last Pap smear result is in Epic.  Physical exam: Breasts Breasts symmetrical. No skin abnormalities bilateral breasts. No nipple retraction bilateral breasts. No nipple discharge bilateral breasts. No lymphadenopathy. No lumps palpated bilateral breasts. No complaints of pain or tenderness on exam. [facility]. Appointment scheduled for Thursday, May 03, 2018 at 1310.        Pelvic/Bimanual   Ext Genitalia No lesions, no swelling and no discharge observed on external genitalia.         Vagina Vagina pink and normal texture. No lesions or discharge observed in vagina.          Cervix Cervix is present. Cervix pink and of normal texture. No discharge observed.     Uterus Uterus is present and palpable. Uterus in normal position and normal size.        Adnexae Bilateral ovaries present and palpable. No tenderness on palpation.         Rectovaginal No rectal exam completed today since patient had no rectal complaints. No skin abnormalities observed on exam.    Smoking History: Patient has never smoked.  Patient Navigation: Patient education provided. Access to services provided for patient through [facility] program. Patients daughter Kennon PortelaKim Hannis signed consent to interpret for patient.  Colorectal Cancer Screening: Per patient had a colonoscopy completed 2 years ago. No complaints today. FIT Test given to patient to complete and return to [facility].  Breast and Cervical Cancer Risk Assessment: Patient has no family history of breast cancer, known genetic mutations, or radiation treatment to the chest before age 52. Patient has no history of cervical dysplasia, immunocompromised, or DES exposure in-utero.  Risk Assessment    Risk Scores      05/03/2018   Last  edited by: Priscille HeidelbergBrannock, Jyll Tomaro P, RN   5-year risk: 0.5 %   Lifetime risk: 3 %         Patients daughter Kennon PortelaKim Burel signed consent to interpret for patient.

## 2018-05-04 ENCOUNTER — Other Ambulatory Visit: Payer: Self-pay

## 2018-05-08 LAB — CYTOLOGY - PAP
Diagnosis: UNDETERMINED — AB
HPV: NOT DETECTED

## 2018-05-17 ENCOUNTER — Other Ambulatory Visit (HOSPITAL_COMMUNITY): Payer: Self-pay | Admitting: *Deleted

## 2018-05-17 DIAGNOSIS — Z0000 Encounter for general adult medical examination without abnormal findings: Secondary | ICD-10-CM

## 2018-05-18 ENCOUNTER — Inpatient Hospital Stay: Payer: Medicaid Other | Attending: Obstetrics and Gynecology

## 2018-05-18 ENCOUNTER — Inpatient Hospital Stay: Payer: Medicaid Other

## 2018-05-18 ENCOUNTER — Ambulatory Visit: Payer: Medicaid Other

## 2018-05-18 DIAGNOSIS — Z0000 Encounter for general adult medical examination without abnormal findings: Secondary | ICD-10-CM | POA: Insufficient documentation

## 2018-05-18 LAB — LIPID PANEL
CHOL/HDL RATIO: 3.4 ratio
Cholesterol: 152 mg/dL (ref 0–200)
HDL: 45 mg/dL (ref >40)
LDL CALC: 95 mg/dL (ref 0–99)
Triglycerides: 62 mg/dL (ref <150)
VLDL: 12 mg/dL (ref 0–40)

## 2018-05-18 NOTE — Progress Notes (Unsigned)
[facility] initial screening  Clinical Measurement:  Height: 62in.  Weight: 148lb  Blood Pressure: 110/72   Blood Pressure #2:  110/68  Fasting Labs Drawn Today, will review with patient when they result.  Medical History:  Patient states that she has not been diagnosed with high cholesterol, high blood pressure, diabetes or heart disease.  Medications:  Patients states she is not taking any medications for high cholesterol, high blood pressure or diabetes.  She is not taking aspirin daily to prevent heart attack or stroke.  Blood pressure, self measurement:  Patients states she does not measure blood pressure at home.    Nutrition:  Patient states she eats 1 cup of fruit and 2 cups of vegetables in an average day.  Patient states she does  eat fish regularly, she eats less than half a serving of whole grains daily. She drinks less than 36 ounces of beverages with added sugar weekly.  She is not  watching her sodium intake.  She has not had any drinks containing alcohol in the last seven days.    Physical activity:  Patient states that she gets 210 minutes of moderate exercise in a week.  She gets 0 minutes of vigorous exercise per week.  Smoking status:  Patient states she has never smoked and is not around any smokers.      Quality of life:  Patient states that she has had 0 bad physical days out of the last 30 days. In the last 2 weeks, she has had 0 days that she has felt down or depressed. She has had 0 days in the last 2 weeks that she has had little interest or pleasure in doing things.  Navigation:  I will notify patient of lab results.  Patient is aware of 2 more health coaching sessions and a follow up.

## 2018-05-18 NOTE — Addendum Note (Signed)
Addended by: Deri FuellingPERKINS, Oksana Deberry R on: 05/18/2018 10:02 AM   Modules accepted: Orders

## 2018-05-19 LAB — HEMOGLOBIN A1C
Hgb A1c MFr Bld: 5.2 % (ref 4.8–5.6)
Mean Plasma Glucose: 103 mg/dL

## 2018-05-20 LAB — FECAL OCCULT BLOOD, IMMUNOCHEMICAL: Fecal Occult Bld: NEGATIVE

## 2018-05-21 ENCOUNTER — Telehealth (HOSPITAL_COMMUNITY): Payer: Self-pay | Admitting: *Deleted

## 2018-05-21 NOTE — Telephone Encounter (Signed)
Health coaching 2  [place] interpreter- [employer]Kim Fortin  Labs- cholesterol 152, triglycerides 62, HDL cholesterol 45. LDL cholesterol 95, hemoglobin A1C  5.2, mean plasma glucose 103   Patient is aware and understands these lab results.  Goals- Patient states that she eats 3 cups of fruits and vegetables daily. I encouraged patient to increase the amount of fruits and vegetables to 5 daily. Patent states that she walks 210 minutes weekly.  I encouraged patient to keep up the good work.  Patient states that she does not watch her sodium intake.  I encouraged patient to keep sodium below 1500 milligrams per American Heart Association.  Navigation:  Patient is aware of 1 more health coaching sessions and a follow up.  Time- 10 minutes

## 2018-05-25 ENCOUNTER — Ambulatory Visit: Payer: No Typology Code available for payment source

## 2018-05-28 ENCOUNTER — Encounter (HOSPITAL_COMMUNITY): Payer: Self-pay

## 2018-06-18 ENCOUNTER — Telehealth (HOSPITAL_COMMUNITY): Payer: Self-pay | Admitting: *Deleted

## 2018-06-18 NOTE — Telephone Encounter (Signed)
Health coaching 3  [place] interpreter- Kennon Portela   Goals-Patient states that she is on track with her goals.  Patient states she continues to eat at least 3 cups of fruits and vegetables.  I encouraged patient to increase the amount of fruits and vegetables to 5 each day. Patient states that she exercises 210 minutes weekly.  I encouraged patient to keep up the good work.  Navigation: Patient is aware of an in person  follow up session.  Time- 10 minutes

## 2018-10-30 ENCOUNTER — Telehealth: Payer: Self-pay

## 2018-10-30 NOTE — Telephone Encounter (Signed)
Called patient via [employer] ID# [id number]. Left message for patient to call back to schedule [facility] follow-up visit. Left name and number for her to call back.

## 2019-06-10 IMAGING — MG DIGITAL SCREENING BILATERAL MAMMOGRAM WITH TOMO AND CAD
6 of 10 series · 6 of 30 positions shown · non-contrast
Comparison: Previous exam(s).

CLINICAL DATA: Screening.

EXAM:
DIGITAL SCREENING BILATERAL MAMMOGRAM WITH TOMO AND CAD

[R MLO synth-2D (1 of 2)]
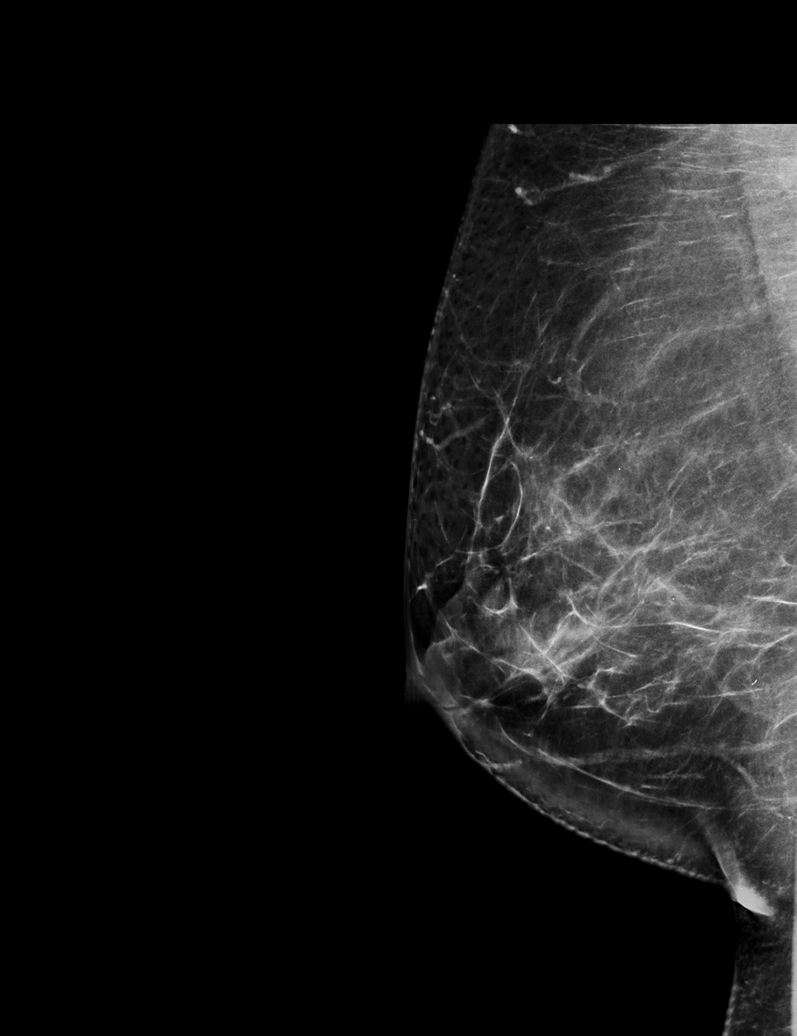

[R MLO synth-2D (2 of 2)]
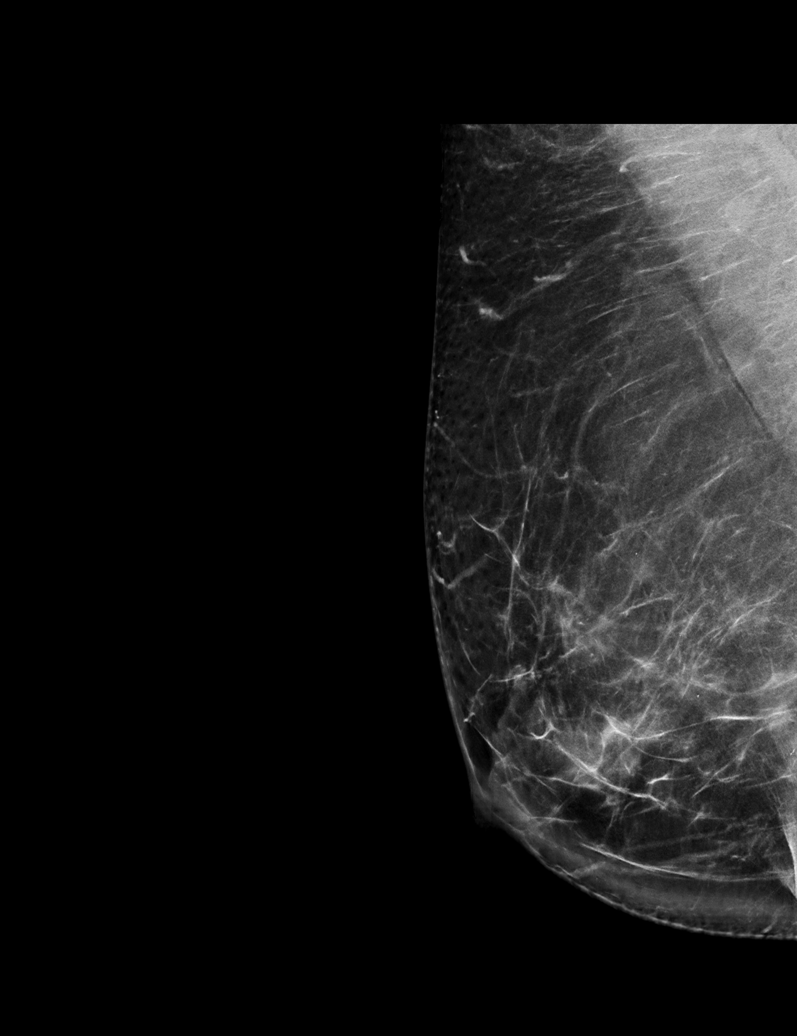

[L MLO synth-2D]
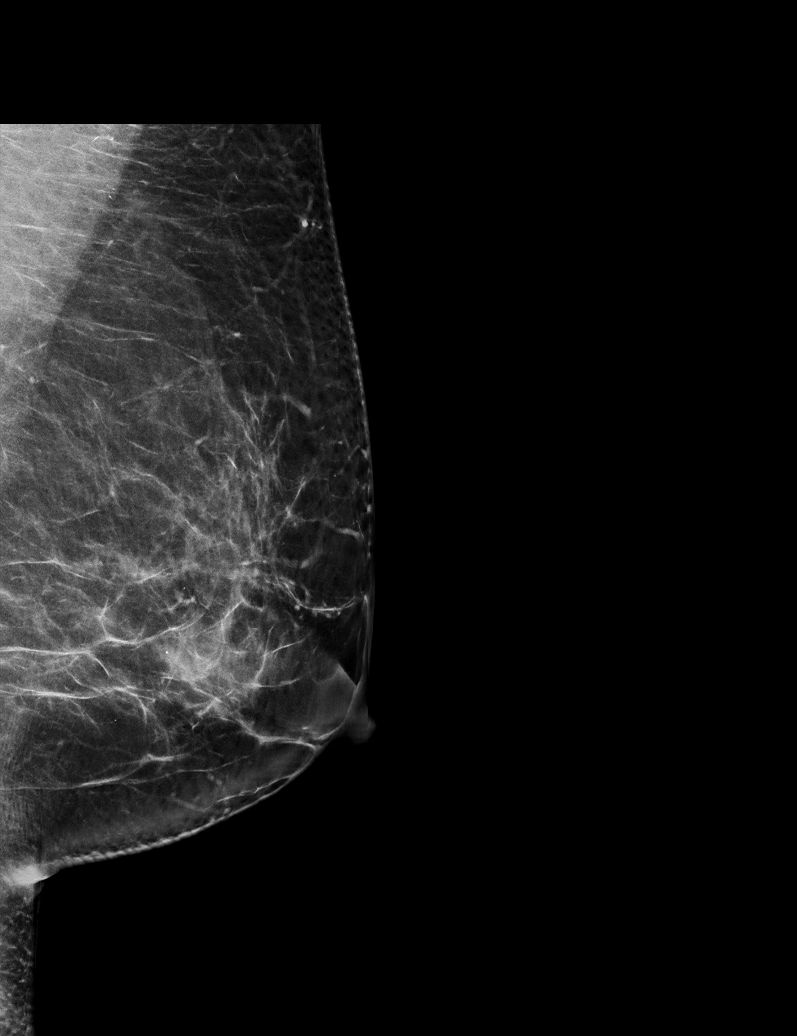

[L CC synth-2D]
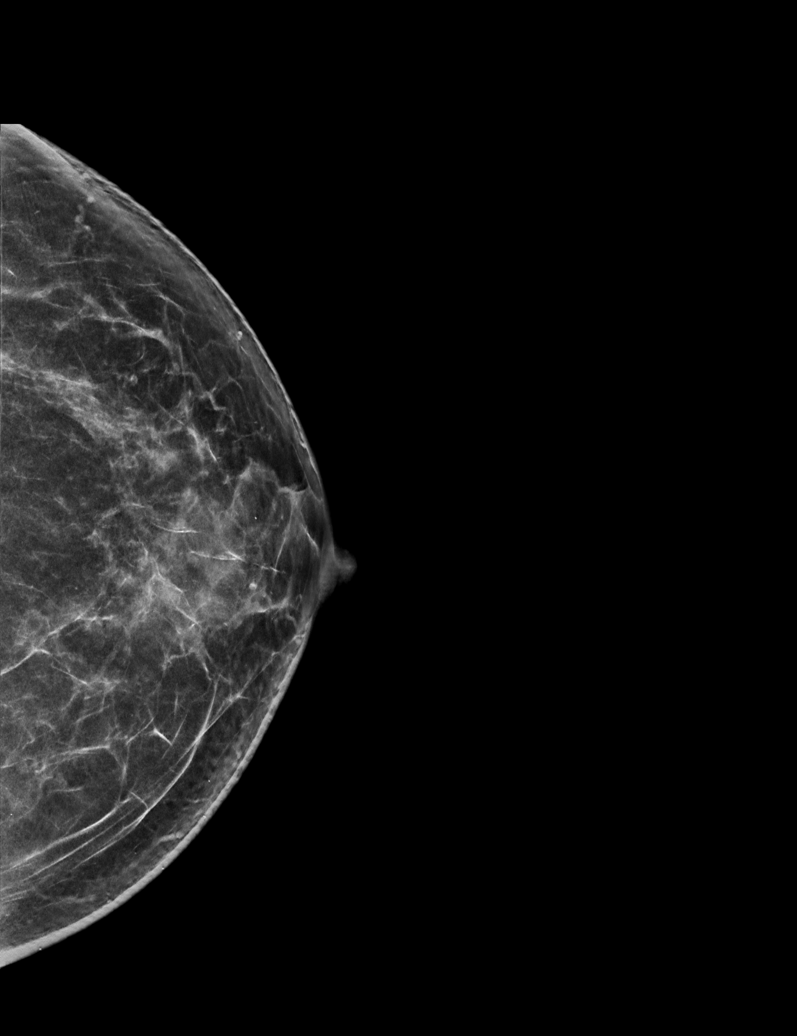

[R CC synth-2D]
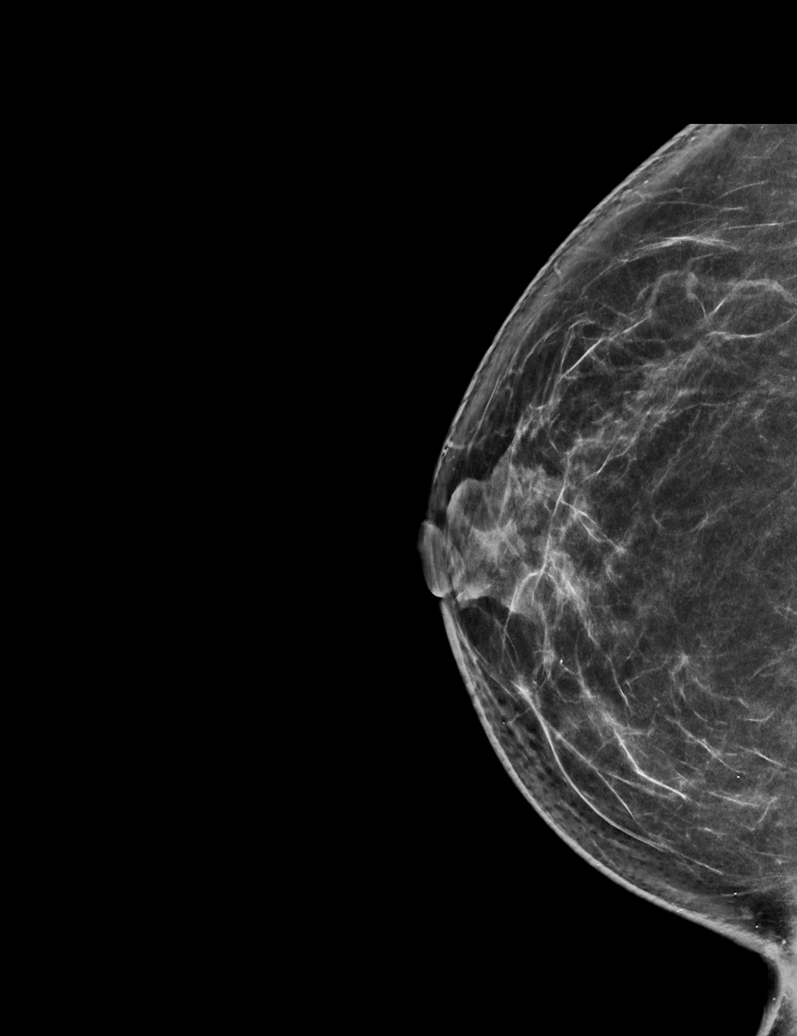

[L CC tomo · tomo slice 35/69.0]
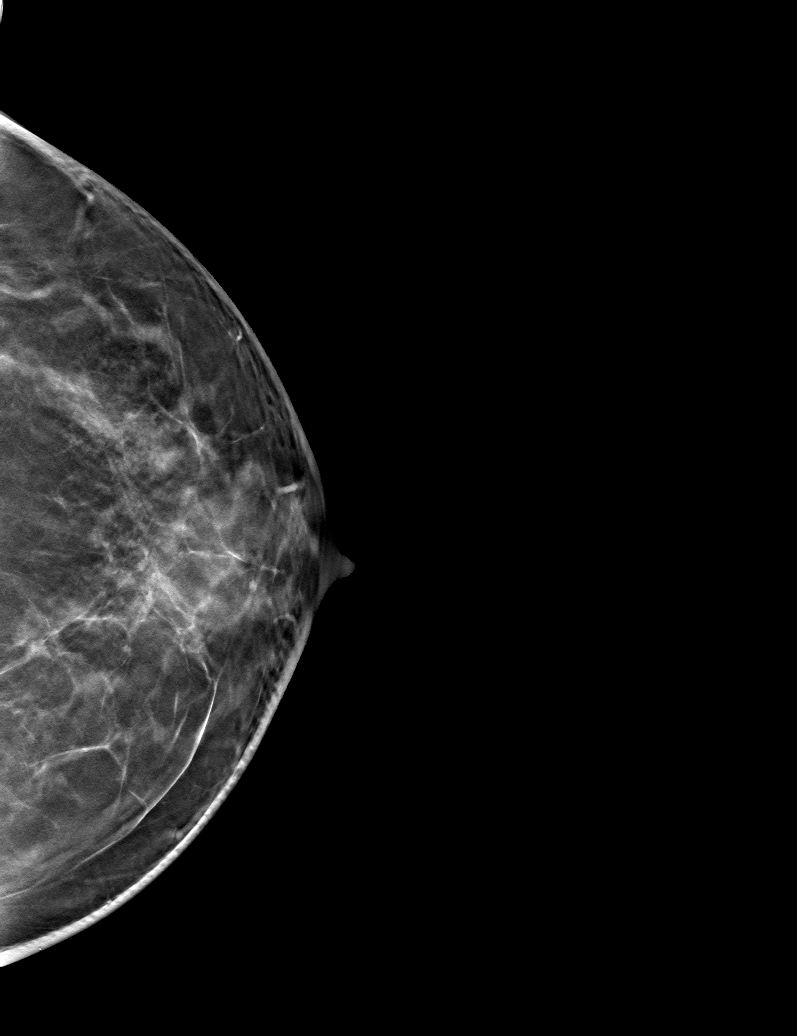

[6 of 30 positions shown; findings below may reference images not displayed]

ACR Breast Density Category c: The breast tissue is heterogeneously
dense, which may obscure small masses.
FINDINGS: There are no findings suspicious for malignancy. Images were
processed with CAD.
IMPRESSION: No mammographic evidence of malignancy. A result letter of this
screening mammogram will be mailed directly to the patient.

RECOMMENDATION:
Screening mammogram in one year. (Code:[id number])

BI-RADS CATEGORY  1: Negative.

## 2019-07-28 ENCOUNTER — Ambulatory Visit: Payer: Medicaid Other
